# Patient Record
Sex: Male | Born: 1955 | Race: White | Hispanic: No | Smoking: Never smoker
Health system: Southern US, Community
[De-identification: ages and names within clinical notes are randomized; demographics above are authoritative.]

## PROBLEM LIST (undated history)

## (undated) DIAGNOSIS — M778 Other enthesopathies, not elsewhere classified: Secondary | ICD-10-CM

## (undated) DIAGNOSIS — T7840XA Allergy, unspecified, initial encounter: Secondary | ICD-10-CM

## (undated) DIAGNOSIS — M1712 Unilateral primary osteoarthritis, left knee: Secondary | ICD-10-CM

## (undated) DIAGNOSIS — I10 Essential (primary) hypertension: Secondary | ICD-10-CM

## (undated) HISTORY — DX: Other enthesopathies, not elsewhere classified: M77.8

## (undated) HISTORY — DX: Allergy, unspecified, initial encounter: T78.40XA

## (undated) HISTORY — PX: OTHER SURGICAL HISTORY: SHX169

## (undated) HISTORY — DX: Essential (primary) hypertension: I10

## (undated) HISTORY — PX: COLONOSCOPY: SHX174

## (undated) HISTORY — PX: POLYPECTOMY: SHX149

## (undated) HISTORY — DX: Unilateral primary osteoarthritis, left knee: M17.12

## (undated) HISTORY — PX: WISDOM TOOTH EXTRACTION: SHX21

---

## 2004-12-03 ENCOUNTER — Ambulatory Visit: Payer: Self-pay | Admitting: Internal Medicine

## 2004-12-10 ENCOUNTER — Ambulatory Visit: Payer: Self-pay | Admitting: Internal Medicine

## 2005-01-22 ENCOUNTER — Ambulatory Visit: Payer: Self-pay | Admitting: Internal Medicine

## 2005-03-12 ENCOUNTER — Ambulatory Visit: Payer: Self-pay | Admitting: Internal Medicine

## 2005-10-16 DIAGNOSIS — D229 Melanocytic nevi, unspecified: Secondary | ICD-10-CM

## 2005-10-16 HISTORY — DX: Melanocytic nevi, unspecified: D22.9

## 2007-02-17 ENCOUNTER — Ambulatory Visit: Payer: Self-pay | Admitting: Internal Medicine

## 2009-06-06 ENCOUNTER — Ambulatory Visit: Payer: Self-pay | Admitting: Internal Medicine

## 2009-06-06 LAB — CONVERTED CEMR LAB
BUN: 13 mg/dL (ref 6–23)
Basophils Absolute: 0 10*3/uL (ref 0.0–0.1)
Bilirubin Urine: NEGATIVE
Blood in Urine, dipstick: NEGATIVE
Cholesterol: 169 mg/dL (ref 0–200)
Creatinine, Ser: 1 mg/dL (ref 0.4–1.5)
GFR calc non Af Amer: 82.85 mL/min (ref >60)
Glucose, Bld: 90 mg/dL (ref 70–99)
Glucose, Urine, Semiquant: NEGATIVE
HCT: 45 % (ref 39.0–52.0)
Ketones, urine, test strip: NEGATIVE
Lymphs Abs: 1.9 10*3/uL (ref 0.7–4.0)
MCV: 88.3 fL (ref 78.0–100.0)
Monocytes Absolute: 0.4 10*3/uL (ref 0.1–1.0)
Monocytes Relative: 8.1 % (ref 3.0–12.0)
Neutrophils Relative %: 52 % (ref 43.0–77.0)
PSA: 1.35 ng/mL (ref 0.10–4.00)
Platelets: 180 10*3/uL (ref 150.0–400.0)
Potassium: 5 meq/L (ref 3.5–5.1)
Protein, U semiquant: NEGATIVE
RDW: 11.9 % (ref 11.5–14.6)
TSH: 3.43 microintl units/mL (ref 0.35–5.50)
Total Bilirubin: 1.1 mg/dL (ref 0.3–1.2)
Triglycerides: 31 mg/dL (ref 0.0–149.0)
Urobilinogen, UA: 0.2
VLDL: 6.2 mg/dL (ref 0.0–40.0)
pH: 7

## 2009-06-14 ENCOUNTER — Ambulatory Visit: Payer: Self-pay | Admitting: Internal Medicine

## 2010-06-27 ENCOUNTER — Ambulatory Visit: Payer: Self-pay | Admitting: Internal Medicine

## 2010-06-28 LAB — CONVERTED CEMR LAB
ALT: 14 units/L (ref 0–53)
Basophils Relative: 0.3 % (ref 0.0–3.0)
Bilirubin, Direct: 0.2 mg/dL (ref 0.0–0.3)
Chloride: 103 meq/L (ref 96–112)
Cholesterol: 164 mg/dL (ref 0–200)
Eosinophils Relative: 1.8 % (ref 0.0–5.0)
HCT: 45 % (ref 39.0–52.0)
Hemoglobin: 15.4 g/dL (ref 13.0–17.0)
LDL Cholesterol: 102 mg/dL — ABNORMAL HIGH (ref 0–99)
Lymphs Abs: 1.8 10*3/uL (ref 0.7–4.0)
MCV: 89.8 fL (ref 78.0–100.0)
Monocytes Absolute: 0.4 10*3/uL (ref 0.1–1.0)
Potassium: 4.3 meq/L (ref 3.5–5.1)
RBC: 5.01 M/uL (ref 4.22–5.81)
TSH: 3.28 microintl units/mL (ref 0.35–5.50)
Total Protein: 6.2 g/dL (ref 6.0–8.3)
WBC: 5.2 10*3/uL (ref 4.5–10.5)

## 2010-12-03 NOTE — Assessment & Plan Note (Signed)
Summary: cpx/pt will come in fasting/njr   Vital Signs:  Patient profile:   55 year old male Height:      67.5 inches Weight:      148 pounds BMI:     22.92 Pulse rate:   68 / minute Pulse rhythm:   regular Resp:     12 per minute BP sitting:   110 / 82  (left arm) Cuff size:   regular  Vitals Entered By: Gladis Riffle, RN (June 27, 2010 9:19 AM) CC: CPX, Is Patient Diabetic? No   CC:  CPX and .  History of Present Illness: cpx  Preventive Screening-Counseling & Management  Alcohol-Tobacco     Smoking Status: never  Current Problems (verified): 1)  Preventive Health Care  (ICD-V70.0)  Current Medications (verified): 1)  None  Allergies (verified): No Known Drug Allergies  Past History:  Past Medical History: Last updated: 06/14/2009 Unremarkable  Past Surgical History: Last updated: 06/14/2009 Denies surgical history  Family History: Last updated: 06/14/2009 mother HTN father living 50 yo  Social History: Last updated: 06/14/2009 Occupation: [employer] Married Never Smoked Alcohol use-yes--minimal  Risk Factors: Smoking Status: never (06/27/2010)  Review of Systems       All other systems reviewed and were negative   Contraindications/Deferment of Procedures/Staging:    Test/Procedure: Colonoscopy    Reason for deferment: patient declined   Physical Exam  General:  alert and well-developed.   Head:  normocephalic and atraumatic.   Eyes:  pupils equal and pupils round.   Ears:  R ear normal and L ear normal.   Neck:  No deformities, masses, or tenderness noted. Chest Wall:  No deformities, masses, tenderness or gynecomastia noted. Lungs:  normal respiratory effort and no intercostal retractions.   Heart:  normal rate and regular rhythm.   Abdomen:  soft and non-tender.  thin Prostate:  refused Msk:  No deformity or scoliosis noted of thoracic or lumbar spine.   Neurologic:  cranial nerves II-XII intact and  gait normal.   Skin:  turgor normal and color normal.   Psych:  normally interactive and good eye contact.     Impression & Recommendations:  Problem # 1:  PREVENTIVE HEALTH CARE (ICD-V70.0) refuses colonoscopy---he accepts all risks including death Orders: Venipuncture (91478) TLB-BMP (Basic Metabolic Panel-BMET) (80048-METABOL) TLB-CBC Platelet - w/Differential (85025-CBCD) TLB-Hepatic/Liver Function Pnl (80076-HEPATIC) TLB-TSH (Thyroid Stimulating Hormone) (84443-TSH) TLB-Lipid Panel (80061-LIPID) TLB-PSA (Prostate Specific Antigen) (84153-PSA) UA Dipstick w/o Micro (automated)  (81003)  Appended Document: Orders Update    Clinical Lists Changes  Orders: Added new Service order of Specimen Handling (29562) - Signed      Appended Document: Orders Update    Clinical Lists Changes  Observations: Added new observation of COMMENTS: Wynona Canes, CMA  June 27, 2010 10:40 AM  (06/27/2010 10:39) Added new observation of PH URINE: 7.0  (06/27/2010 10:39) Added new observation of SPEC GR URIN: 1.020  (06/27/2010 10:39) Added new observation of APPEARANCE U: Clear  (06/27/2010 10:39) Added new observation of UA COLOR: yellow  (06/27/2010 10:39) Added new observation of WBC DIPSTK U: negative  (06/27/2010 10:39) Added new observation of NITRITE URN: negative  (06/27/2010 10:39) Added new observation of UROBILINOGEN: 0.2  (06/27/2010 10:39) Added new observation of PROTEIN, URN: negative  (06/27/2010 10:39) Added new observation of BLOOD UR DIP: negative  (06/27/2010 10:39) Added new observation of KETONES URN: negative  (06/27/2010 10:39) Added new observation of BILIRUBIN UR: negative  (06/27/2010 10:39) Added new observation  of GLUCOSE, URN: negative  (06/27/2010 10:39)      Laboratory Results   Urine Tests  Date/Time Recieved: June 27, 2010 10:39 AM  Date/Time Reported: June 27, 2010 10:39 AM   Routine Urinalysis   Color: yellow Appearance:  Clear Glucose: negative   (Normal Range: Negative) Bilirubin: negative   (Normal Range: Negative) Ketone: negative   (Normal Range: Negative) Spec. Gravity: 1.020   (Normal Range: 1.003-1.035) Blood: negative   (Normal Range: Negative) pH: 7.0   (Normal Range: 5.0-8.0) Protein: negative   (Normal Range: Negative) Urobilinogen: 0.2   (Normal Range: 0-1) Nitrite: negative   (Normal Range: Negative) Leukocyte Esterace: negative   (Normal Range: Negative)    Comments: Wynona Canes, CMA  June 27, 2010 10:40 AM

## 2011-11-27 ENCOUNTER — Other Ambulatory Visit (INDEPENDENT_AMBULATORY_CARE_PROVIDER_SITE_OTHER): Payer: BC Managed Care – PPO

## 2011-11-27 DIAGNOSIS — Z0000 Encounter for general adult medical examination without abnormal findings: Secondary | ICD-10-CM

## 2011-11-27 LAB — CBC WITH DIFFERENTIAL/PLATELET
Basophils Relative: 0.3 % (ref 0.0–3.0)
Eosinophils Relative: 1.9 % (ref 0.0–5.0)
HCT: 46.5 % (ref 39.0–52.0)
Hemoglobin: 15.5 g/dL (ref 13.0–17.0)
Lymphs Abs: 1.9 10*3/uL (ref 0.7–4.0)
Monocytes Relative: 7.9 % (ref 3.0–12.0)
Neutro Abs: 2.4 10*3/uL (ref 1.4–7.7)
RBC: 5.1 Mil/uL (ref 4.22–5.81)
RDW: 13 % (ref 11.5–14.6)
WBC: 4.9 10*3/uL (ref 4.5–10.5)

## 2011-11-27 LAB — POCT URINALYSIS DIPSTICK
Bilirubin, UA: NEGATIVE
Ketones, UA: NEGATIVE
Leukocytes, UA: NEGATIVE
Nitrite, UA: NEGATIVE

## 2011-11-27 LAB — BASIC METABOLIC PANEL
GFR: 81.16 mL/min (ref >60.00)
Glucose, Bld: 82 mg/dL (ref 70–99)
Potassium: 4.2 mEq/L (ref 3.5–5.1)
Sodium: 140 mEq/L (ref 135–145)

## 2011-11-27 LAB — HEPATIC FUNCTION PANEL
ALT: 18 U/L (ref 0–53)
AST: 23 U/L (ref 0–37)
Albumin: 4 g/dL (ref 3.5–5.2)
Total Protein: 6.3 g/dL (ref 6.0–8.3)

## 2011-11-27 LAB — TSH: TSH: 4.35 u[IU]/mL (ref 0.35–5.50)

## 2011-11-27 LAB — PSA: PSA: 1.46 ng/mL (ref 0.10–4.00)

## 2011-11-27 LAB — LIPID PANEL: Total CHOL/HDL Ratio: 3

## 2011-12-03 ENCOUNTER — Ambulatory Visit (INDEPENDENT_AMBULATORY_CARE_PROVIDER_SITE_OTHER): Payer: BC Managed Care – PPO | Admitting: Internal Medicine

## 2011-12-03 ENCOUNTER — Encounter: Payer: Self-pay | Admitting: Internal Medicine

## 2011-12-03 VITALS — BP 110/82 | HR 68 | Temp 97.9°F | Ht 68.0 in | Wt 154.0 lb

## 2011-12-03 DIAGNOSIS — Z0000 Encounter for general adult medical examination without abnormal findings: Secondary | ICD-10-CM

## 2011-12-03 NOTE — Progress Notes (Signed)
  Subjective:    Patient ID: Louis Vaughn, male    DOB: 08/23/1956, 56 y.o.   MRN: [id number]  HPI  cpx  No past medical history on file. No past surgical history on file.  reports that he has never smoked. He does not have any smokeless tobacco history on file. He reports that he drinks alcohol. He reports that he does not use illicit drugs. family history includes Hypertension in his mother. No Known Allergies   Review of Systems  patient denies chest pain, shortness of breath, orthopnea. Denies lower extremity edema, abdominal pain, change in appetite, change in bowel movements. Patient denies rashes, musculoskeletal complaints. No other specific complaints in a complete review of systems.      Objective:   Physical Exam  well-developed well-nourished male in no acute distress. HEENT exam atraumatic, normocephalic, neck supple without jugular venous distention. Chest clear to auscultation cardiac exam S1-S2 are regular. Abdominal exam overweight with bowel sounds, soft and nontender. Extremities no edema. Neurologic exam is alert with a normal gait.        Assessment & Plan:  Well visit-health maint utd

## 2012-03-10 ENCOUNTER — Ambulatory Visit: Payer: BC Managed Care – PPO | Admitting: Family

## 2012-11-02 ENCOUNTER — Ambulatory Visit (INDEPENDENT_AMBULATORY_CARE_PROVIDER_SITE_OTHER)
Admission: RE | Admit: 2012-11-02 | Discharge: 2012-11-02 | Disposition: A | Discharge status: Home / Self Care | Payer: BC Managed Care – PPO | Source: Ambulatory Visit | Attending: Family Medicine | Admitting: Family Medicine

## 2012-11-02 ENCOUNTER — Telehealth: Payer: Self-pay | Admitting: Family Medicine

## 2012-11-02 ENCOUNTER — Ambulatory Visit (INDEPENDENT_AMBULATORY_CARE_PROVIDER_SITE_OTHER): Payer: BC Managed Care – PPO | Admitting: Family Medicine

## 2012-11-02 ENCOUNTER — Encounter: Payer: Self-pay | Admitting: Family Medicine

## 2012-11-02 ENCOUNTER — Telehealth: Payer: Self-pay | Admitting: Internal Medicine

## 2012-11-02 VITALS — BP 152/90 | HR 67 | Temp 97.8°F | Wt 153.0 lb

## 2012-11-02 DIAGNOSIS — M79642 Pain in left hand: Secondary | ICD-10-CM

## 2012-11-02 DIAGNOSIS — M79609 Pain in unspecified limb: Secondary | ICD-10-CM

## 2012-11-02 DIAGNOSIS — S62308A Unspecified fracture of other metacarpal bone, initial encounter for closed fracture: Secondary | ICD-10-CM

## 2012-11-02 NOTE — Progress Notes (Addendum)
Chief Complaint  Patient presents with  . Fall    hurt left hand; swollen and redness; using ice and heat; sharpe pain     HPI:  Acute visit for L hand pain: -fell on grass 2 days ago -fell on flex wrist - hurt a little and but can move it fine -but has had some swelling and pain in a specific point in fifth metacarpal - worried about a fx of   ROS: See pertinent positives and negatives per HPI.  No past medical history on file.  Family History  Problem Relation Age of Onset  . Hypertension Mother     History   Social History  . Marital Status: Single    Spouse Name: N/A    Number of Children: N/A  . Years of Education: N/A   Social History Main Topics  . Smoking status: Never Smoker   . Smokeless tobacco: None  . Alcohol Use: Yes  . Drug Use: No  . Sexually Active:    Other Topics Concern  . None   Social History Narrative  . None    Current outpatient prescriptions:Ciclopirox 1 % shampoo, Apply topically 2 (two) times daily. , Disp: , Rfl:   EXAM:  Filed Vitals:   11/02/12 0943  BP: 152/90  Pulse: 67  Temp: 97.8 F (36.6 C)    There is no height on file to calculate BMI.  GENERAL: vitals reviewed and listed above, alert, oriented, appears well hydrated and in no acute distress  MS: moves all extremities without noticeable abnormality -normal motion and strength throughout in bilat hands and wrists -normal sensation in hand and fingers, normal radial pulses and distal cap refill -TTP in area of L 4-5 distal metacarpals, minimal swelling and bruising of dorsal ulnar hand -no snuff box, radial, ulnar or carpal TTP  PSYCH: pleasant and cooperative, no obvious depression or anxiety  ASSESSMENT AND PLAN:  Discussed the following assessment and plan:  1. Hand pain, left  DG Hand Complete Left, DG Wrist Complete Left   -will get plain films to exclude fx, if neg suspect contusion -Patient advised to return or notify a doctor immediately if symptoms  worsen or persist or new concerns arise and in 2 weeks if not resolved.  Patient Instructions  -use ibuprofen if needed according to instructions  -ice twice daily for 15 minutes  -we will contact you about xray results     Spurgeon Gancarz, Dahlia Client R.

## 2012-11-02 NOTE — Telephone Encounter (Signed)
Please let him know - he did fracture his hand. Trying to get him in with ortho today as needs splint and for further eval.

## 2012-11-02 NOTE — Patient Instructions (Addendum)
-  use ibuprofen if needed according to instructions  -ice twice daily for 15 minutes  -we will contact you about xray results

## 2012-11-02 NOTE — Telephone Encounter (Signed)
Called and left a message for pt to return call.  

## 2012-11-02 NOTE — Telephone Encounter (Signed)
Pt is requesting hand xray results

## 2012-11-02 NOTE — Telephone Encounter (Signed)
Called pt at [phone] and gave x-ray results.  Per Dr. Selena Batten pt has a finger fracture that needs orthopedic treatment.  Pt is aware that this is a complicated fracture due to location and pt could possibly need a splint if not more.  Pt states he understand and is on his way to the orthopedic specialist.

## 2012-11-02 NOTE — Telephone Encounter (Signed)
See previous note. Duplicate message.   

## 2012-12-18 ENCOUNTER — Other Ambulatory Visit: Payer: Self-pay

## 2013-03-26 IMAGING — CR DG HAND COMPLETE 3+V*L*
3 series · 3 of 3 positions shown · non-contrast
Comparison: None.

CLINICAL DATA: Pain, swelling over fourth and fifth metacarpals.
Fall.

LEFT HAND - COMPLETE 3+ VIEW

[view not recorded (1 of 3)]
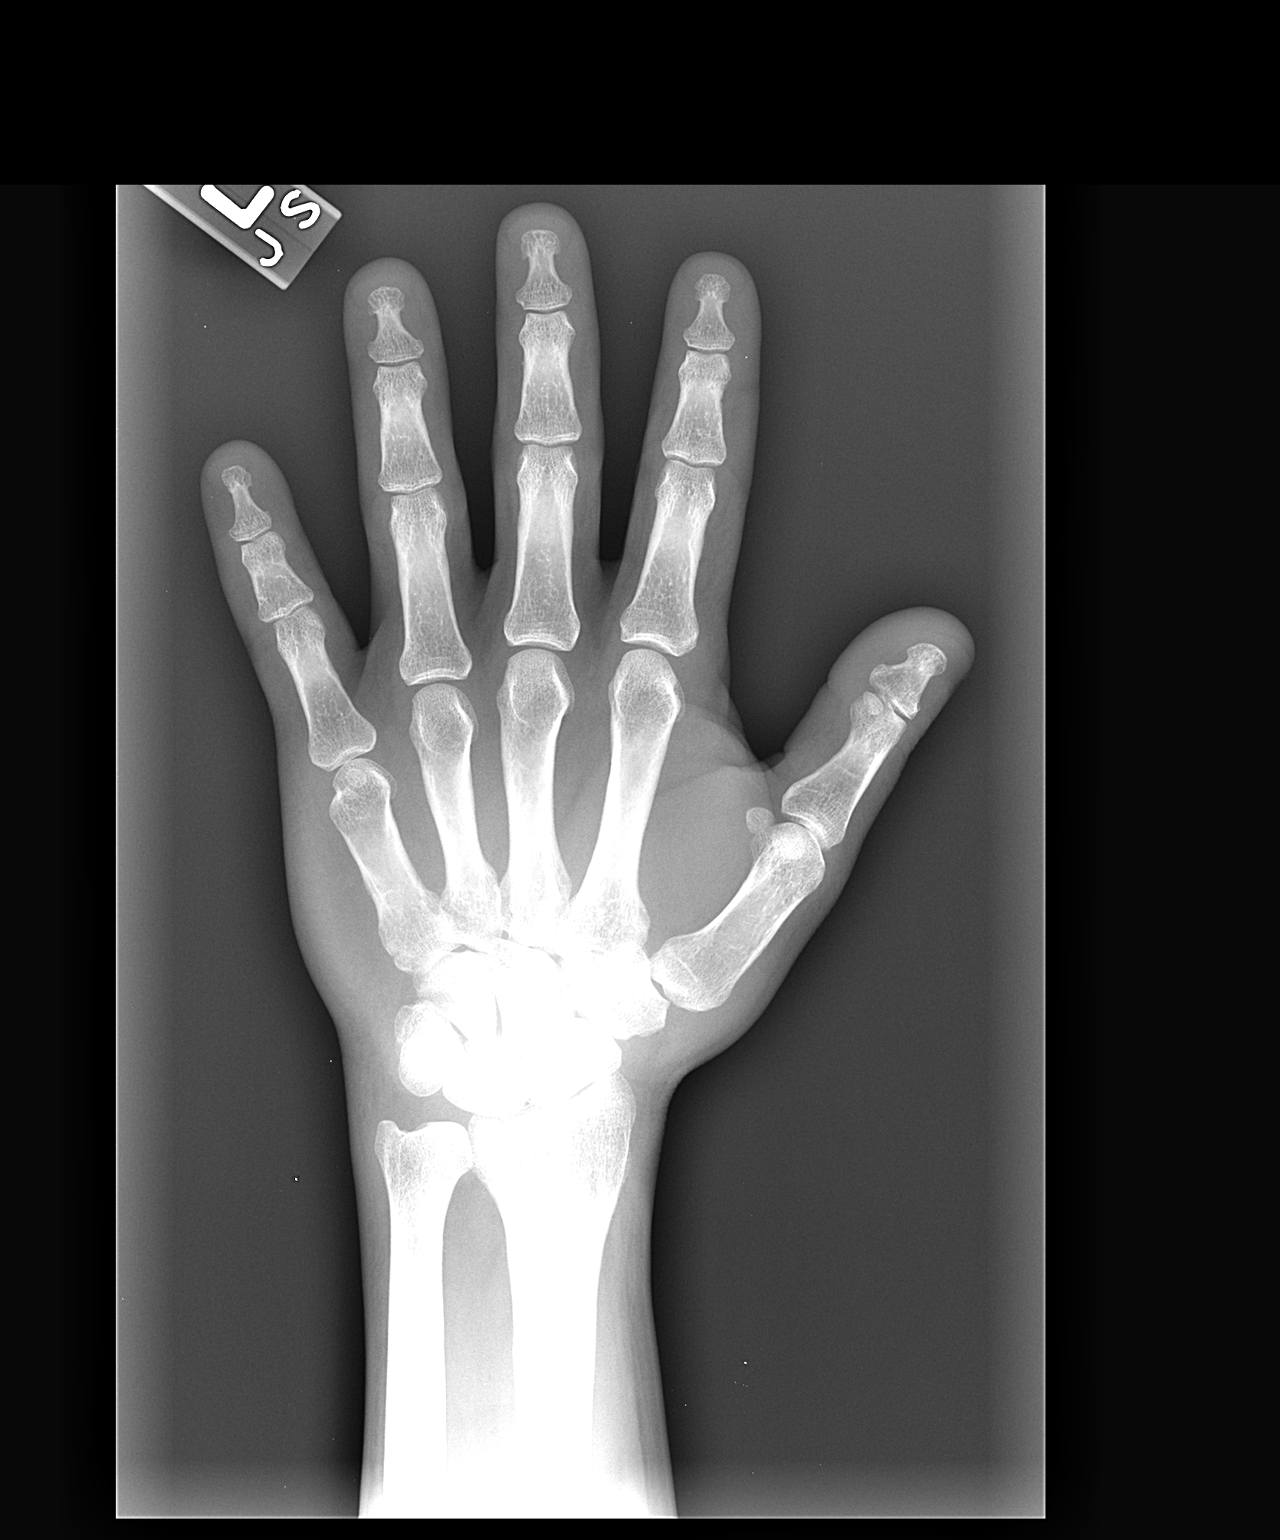

[view not recorded (2 of 3)]
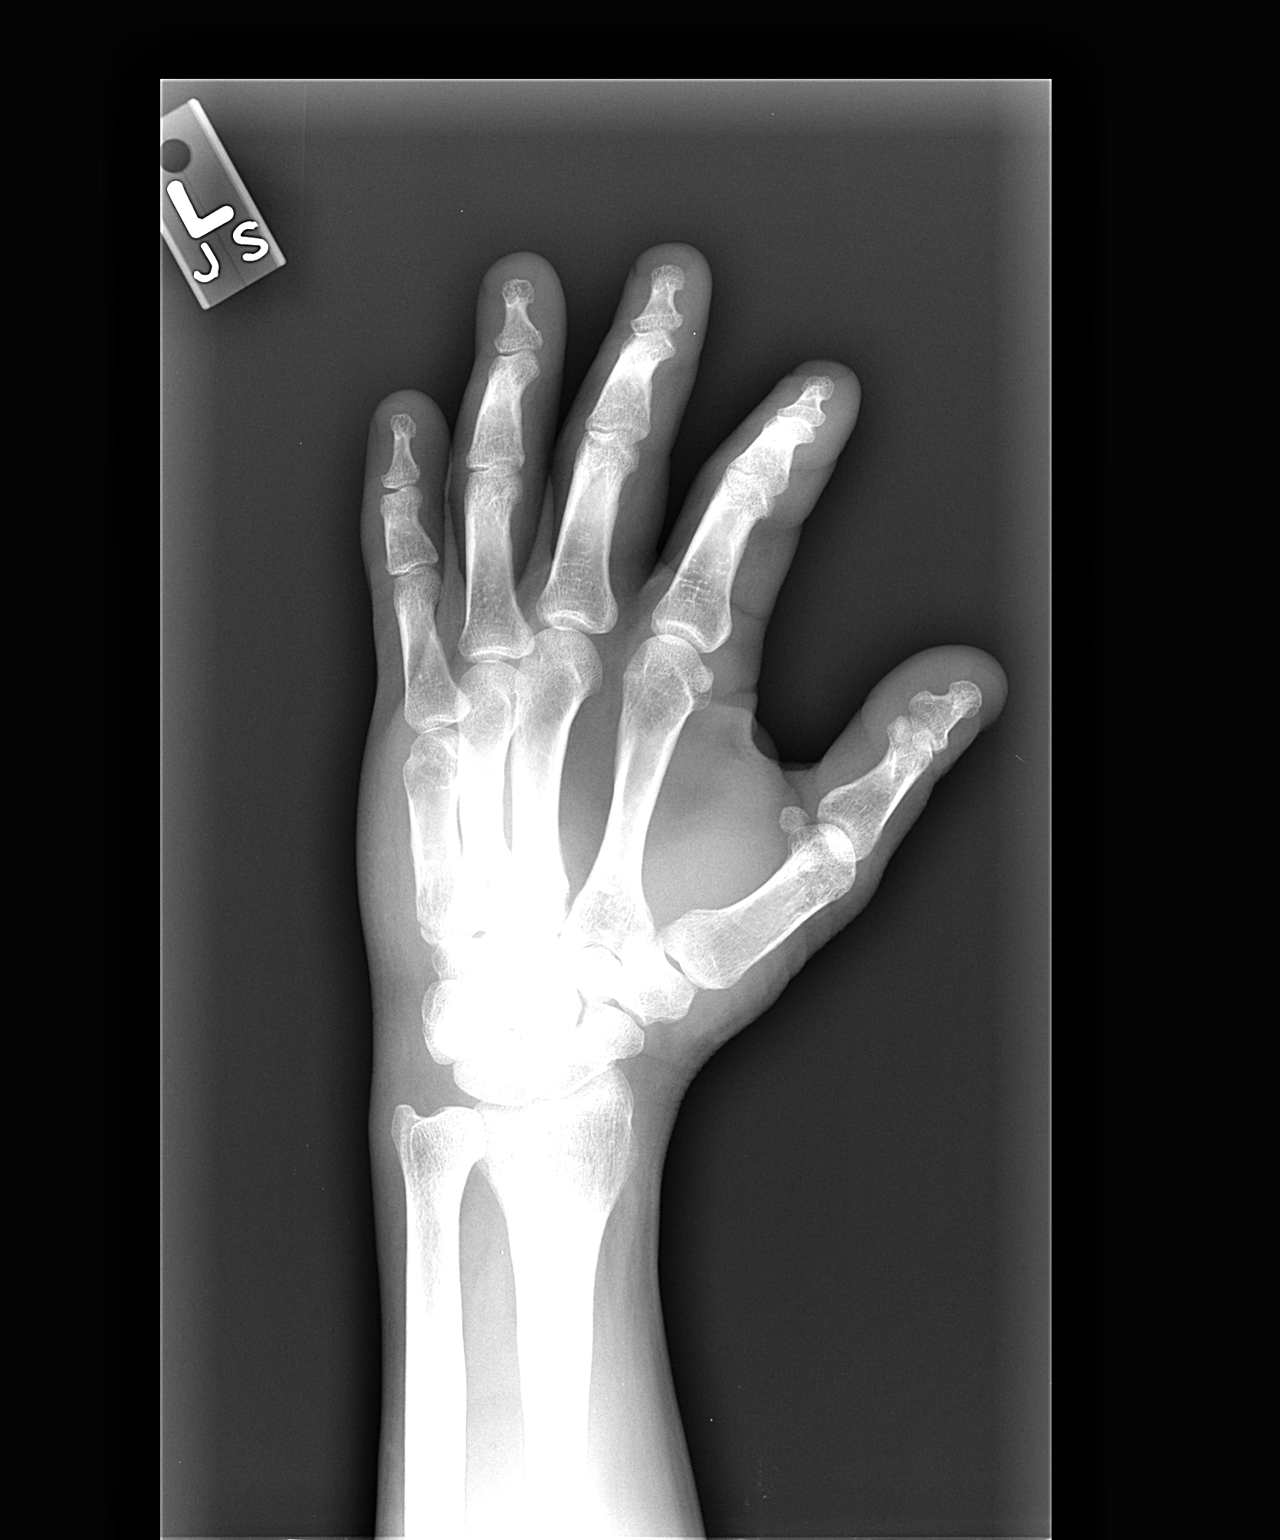

[view not recorded (3 of 3)]
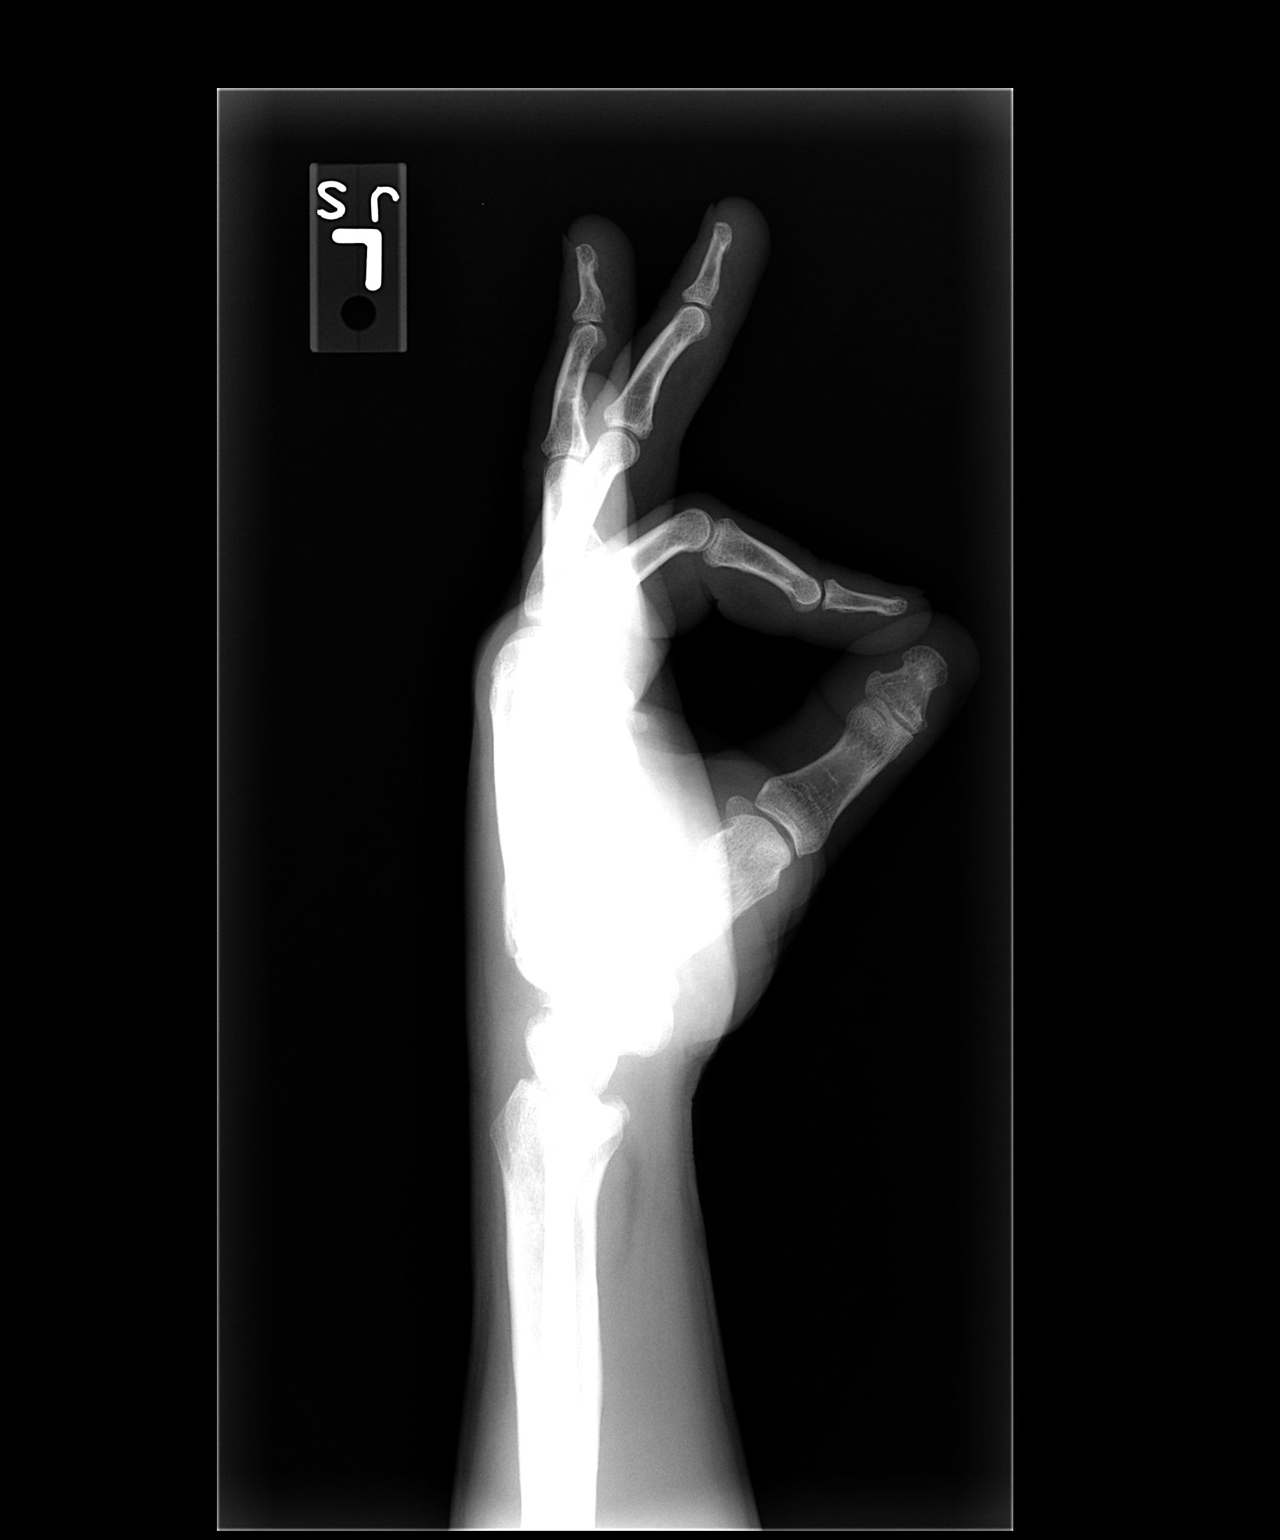

[3 of 3 positions shown; findings below may reference images not displayed]

FINDINGS: There is a nondisplaced fracture noted at the base of the
left fifth metacarpal seen best on the oblique view.  Overlying
soft tissue swelling.  Congenitally short fifth metacarpal.  The
joint spaces are maintained.  Normal bone mineralization.
IMPRESSION: Fracture through the base of the left fifth metacarpal.

## 2013-09-08 ENCOUNTER — Other Ambulatory Visit: Payer: Self-pay

## 2014-01-18 ENCOUNTER — Ambulatory Visit (INDEPENDENT_AMBULATORY_CARE_PROVIDER_SITE_OTHER): Payer: BC Managed Care – PPO | Admitting: Internal Medicine

## 2014-01-18 ENCOUNTER — Encounter: Payer: Self-pay | Admitting: Internal Medicine

## 2014-01-18 VITALS — BP 134/92 | HR 60 | Temp 97.4°F | Ht 68.0 in | Wt 148.0 lb

## 2014-01-18 DIAGNOSIS — Z0000 Encounter for general adult medical examination without abnormal findings: Secondary | ICD-10-CM

## 2014-01-18 LAB — CBC WITH DIFFERENTIAL/PLATELET
BASOS ABS: 0 10*3/uL (ref 0.0–0.1)
Basophils Relative: 0.3 % (ref 0.0–3.0)
EOS ABS: 0.3 10*3/uL (ref 0.0–0.7)
Eosinophils Relative: 5.6 % — ABNORMAL HIGH (ref 0.0–5.0)
HCT: 45.2 % (ref 39.0–52.0)
Hemoglobin: 15.2 g/dL (ref 13.0–17.0)
LYMPHS PCT: 27.2 % (ref 12.0–46.0)
Lymphs Abs: 1.7 10*3/uL (ref 0.7–4.0)
MCHC: 33.5 g/dL (ref 30.0–36.0)
MCV: 90.2 fl (ref 78.0–100.0)
MONO ABS: 0.5 10*3/uL (ref 0.1–1.0)
Monocytes Relative: 8.5 % (ref 3.0–12.0)
NEUTROS PCT: 58.4 % (ref 43.0–77.0)
Neutro Abs: 3.6 10*3/uL (ref 1.4–7.7)
Platelets: 225 10*3/uL (ref 150.0–400.0)
RBC: 5.02 Mil/uL (ref 4.22–5.81)
RDW: 12.9 % (ref 11.5–14.6)
WBC: 6.1 10*3/uL (ref 4.5–10.5)

## 2014-01-18 LAB — POCT URINALYSIS DIPSTICK
BILIRUBIN UA: NEGATIVE
Blood, UA: NEGATIVE
GLUCOSE UA: NEGATIVE
LEUKOCYTES UA: NEGATIVE
NITRITE UA: NEGATIVE
PH UA: 5.5
Protein, UA: NEGATIVE
Spec Grav, UA: 1.02
UROBILINOGEN UA: 0.2

## 2014-01-18 LAB — BASIC METABOLIC PANEL
BUN: 10 mg/dL (ref 6–23)
CALCIUM: 9.2 mg/dL (ref 8.4–10.5)
CHLORIDE: 105 meq/L (ref 96–112)
CO2: 28 meq/L (ref 19–32)
CREATININE: 0.9 mg/dL (ref 0.4–1.5)
GFR: 94.43 mL/min (ref >60.00)
GLUCOSE: 93 mg/dL (ref 70–99)
Potassium: 4.1 mEq/L (ref 3.5–5.1)
Sodium: 138 mEq/L (ref 135–145)

## 2014-01-18 LAB — HEPATIC FUNCTION PANEL
ALBUMIN: 4 g/dL (ref 3.5–5.2)
ALK PHOS: 71 U/L (ref 39–117)
ALT: 20 U/L (ref 0–53)
AST: 33 U/L (ref 0–37)
BILIRUBIN DIRECT: 0.1 mg/dL (ref 0.0–0.3)
TOTAL PROTEIN: 6.3 g/dL (ref 6.0–8.3)
Total Bilirubin: 0.9 mg/dL (ref 0.3–1.2)

## 2014-01-18 LAB — PSA: PSA: 1.77 ng/mL (ref 0.10–4.00)

## 2014-01-18 LAB — LIPID PANEL
CHOL/HDL RATIO: 3
Cholesterol: 141 mg/dL (ref 0–200)
HDL: 53.8 mg/dL (ref >39.00)
LDL Cholesterol: 81 mg/dL (ref 0–99)
Triglycerides: 31 mg/dL (ref 0.0–149.0)
VLDL: 6.2 mg/dL (ref 0.0–40.0)

## 2014-01-18 LAB — TSH: TSH: 4.41 u[IU]/mL (ref 0.35–5.50)

## 2014-01-18 NOTE — Progress Notes (Signed)
Pre visit review using our clinic review tool, if applicable. No additional management support is needed unless otherwise documented below in the visit note. 

## 2014-01-18 NOTE — Progress Notes (Signed)
cpx  No past medical history on file.  History   Social History  . Marital Status: Single    Spouse Name: N/A    Number of Children: N/A  . Years of Education: N/A   Occupational History  . Not on file.   Social History Main Topics  . Smoking status: Never Smoker   . Smokeless tobacco: Not on file  . Alcohol Use: Yes  . Drug Use: No  . Sexual Activity:    Other Topics Concern  . Not on file   Social History Narrative  . No narrative on file    No past surgical history on file.  Family History  Problem Relation Age of Onset  . Hypertension Mother     No Known Allergies  Current Outpatient Prescriptions on File Prior to Visit  Medication Sig Dispense Refill  . Ciclopirox 1 % shampoo Apply topically 2 (two) times daily.        No current facility-administered medications on file prior to visit.     patient denies chest pain, shortness of breath, orthopnea. Denies lower extremity edema, abdominal pain, change in appetite, change in bowel movements. Patient denies rashes, musculoskeletal complaints. No other specific complaints in a complete review of systems.   Reviewed vitals  well-developed well-nourished male in no acute distress. HEENT exam atraumatic, normocephalic, neck supple without jugular venous distention. Chest clear to auscultation cardiac exam S1-S2 are regular. Abdominal exam overweight with bowel sounds, soft and nontender. Extremities no edema. Neurologic exam is alert with a normal gait.   Well visit- health maint UTD

## 2014-01-19 ENCOUNTER — Other Ambulatory Visit: Payer: BC Managed Care – PPO

## 2014-01-23 ENCOUNTER — Encounter: Payer: BC Managed Care – PPO | Admitting: Internal Medicine

## 2014-02-20 ENCOUNTER — Encounter: Payer: Self-pay | Admitting: Internal Medicine

## 2014-08-15 ENCOUNTER — Encounter: Payer: Self-pay | Admitting: Internal Medicine

## 2014-08-15 ENCOUNTER — Ambulatory Visit (INDEPENDENT_AMBULATORY_CARE_PROVIDER_SITE_OTHER): Payer: BC Managed Care – PPO | Admitting: Internal Medicine

## 2014-08-15 VITALS — BP 160/92 | HR 78 | Temp 98.2°F | Resp 18 | Ht 68.0 in | Wt 151.0 lb

## 2014-08-15 DIAGNOSIS — I10 Essential (primary) hypertension: Secondary | ICD-10-CM

## 2014-08-15 MED ORDER — LISINOPRIL 20 MG PO TABS: 20.0000 mg | ORAL_TABLET | Freq: Every day | ORAL | Status: DC

## 2014-08-15 NOTE — Patient Instructions (Signed)
Limit your sodium (Salt) intake  Please check your blood pressure on a regular basis.  If it is consistently greater than 150/90, please make an office appointment. Hypertension Hypertension, commonly called high blood pressure, is when the force of blood pumping through your arteries is too strong. Your arteries are the blood vessels that carry blood from your heart throughout your body. A blood pressure reading consists of a higher number over a lower number, such as 110/72. The higher number (systolic) is the pressure inside your arteries when your heart pumps. The lower number (diastolic) is the pressure inside your arteries when your heart relaxes. Ideally you want your blood pressure below 120/80. Hypertension forces your heart to work harder to pump blood. Your arteries may become narrow or stiff. Having hypertension puts you at risk for heart disease, stroke, and other problems.  RISK FACTORS Some risk factors for high blood pressure are controllable. Others are not.  Risk factors you cannot control include:   Race. You may be at higher risk if you are African American.  Age. Risk increases with age.  Gender. Men are at higher risk than women before age 19 years. After age 42, women are at higher risk than men. Risk factors you can control include:  Not getting enough exercise or physical activity.  Being overweight.  Getting too much fat, sugar, calories, or salt in your diet.  Drinking too much alcohol. SIGNS AND SYMPTOMS Hypertension does not usually cause signs or symptoms. Extremely high blood pressure (hypertensive crisis) may cause headache, anxiety, shortness of breath, and nosebleed. DIAGNOSIS  To check if you have hypertension, your health care provider will measure your blood pressure while you are seated, with your arm held at the level of your heart. It should be measured at least twice using the same arm. Certain conditions can cause a difference in blood pressure  between your right and left arms. A blood pressure reading that is higher than normal on one occasion does not mean that you need treatment. If one blood pressure reading is high, ask your health care provider about having it checked again. TREATMENT  Treating high blood pressure includes making lifestyle changes and possibly taking medicine. Living a healthy lifestyle can help lower high blood pressure. You may need to change some of your habits. Lifestyle changes may include:  Following the DASH diet. This diet is high in fruits, vegetables, and whole grains. It is low in salt, red meat, and added sugars.  Getting at least 2 hours of brisk physical activity every week.  Losing weight if necessary.  Not smoking.  Limiting alcoholic beverages.  Learning ways to reduce stress. If lifestyle changes are not enough to get your blood pressure under control, your health care provider may prescribe medicine. You may need to take more than one. Work closely with your health care provider to understand the risks and benefits. HOME CARE INSTRUCTIONS  Have your blood pressure rechecked as directed by your health care provider.   Take medicines only as directed by your health care provider. Follow the directions carefully. Blood pressure medicines must be taken as prescribed. The medicine does not work as well when you skip doses. Skipping doses also puts you at risk for problems.   Do not smoke.   Monitor your blood pressure at home as directed by your health care provider. SEEK MEDICAL CARE IF:   You think you are having a reaction to medicines taken.  You have recurrent headaches or feel  dizzy.  You have swelling in your ankles.  You have trouble with your vision. SEEK IMMEDIATE MEDICAL CARE IF:  You develop a severe headache or confusion.  You have unusual weakness, numbness, or feel faint.  You have severe chest or abdominal pain.  You vomit repeatedly.  You have trouble  breathing. MAKE SURE YOU:   Understand these instructions.  Will watch your condition.  Will get help right away if you are not doing well or get worse. Document Released: 10/20/2005 Document Revised: 03/06/2014 Document Reviewed: 08/12/2013 [facility] Patient Information 2015 [place], [place]. This information is not intended to replace advice given to you by your health care provider. Make sure you discuss any questions you have with your health care provider. DASH Eating Plan DASH stands for "Dietary Approaches to Stop Hypertension." The DASH eating plan is a healthy eating plan that has been shown to reduce high blood pressure (hypertension). Additional health benefits may include reducing the risk of type 2 diabetes mellitus, heart disease, and stroke. The DASH eating plan may also help with weight loss. WHAT DO I NEED TO KNOW ABOUT THE DASH EATING PLAN? For the DASH eating plan, you will follow these general guidelines:  Choose foods with a percent daily value for sodium of less than 5% (as listed on the food label).  Use salt-free seasonings or herbs instead of table salt or sea salt.  Check with your health care provider or pharmacist before using salt substitutes.  Eat lower-sodium products, often labeled as "lower sodium" or "no salt added."  Eat fresh foods.  Eat more vegetables, fruits, and low-fat dairy products.  Choose whole grains. Look for the word "whole" as the first word in the ingredient list.  Choose fish and skinless chicken or [place] more often than red meat. Limit fish, poultry, and meat to 6 oz (170 g) each day.  Limit sweets, desserts, sugars, and sugary drinks.  Choose heart-healthy fats.  Limit cheese to 1 oz (28 g) per day.  Eat more home-cooked food and less restaurant, buffet, and fast food.  Limit fried foods.  Cook foods using methods other than frying.  Limit canned vegetables. If you do use them, rinse them well to decrease the  sodium.  When eating at a restaurant, ask that your food be prepared with less salt, or no salt if possible. WHAT FOODS CAN I EAT? Seek help from a dietitian for individual calorie needs. Grains Whole grain or whole wheat bread. Brown rice. Whole grain or whole wheat pasta. Quinoa, bulgur, and whole grain cereals. Low-sodium cereals. Corn or whole wheat flour tortillas. Whole grain cornbread. Whole grain crackers. Low-sodium crackers. Vegetables Fresh or frozen vegetables (raw, steamed, roasted, or grilled). Low-sodium or reduced-sodium tomato and vegetable juices. Low-sodium or reduced-sodium tomato sauce and paste. Low-sodium or reduced-sodium canned vegetables.  Fruits All fresh, canned (in natural juice), or frozen fruits. Meat and Other Protein Products Ground beef (85% or leaner), grass-fed beef, or beef trimmed of fat. Skinless chicken or [place]. Ground chicken or [place]. Pork trimmed of fat. All fish and seafood. Eggs. Dried beans, peas, or lentils. Unsalted nuts and seeds. Unsalted canned beans. Dairy Low-fat dairy products, such as skim or 1% milk, 2% or reduced-fat cheeses, low-fat ricotta or cottage cheese, or plain low-fat yogurt. Low-sodium or reduced-sodium cheeses. Fats and Oils Tub margarines without trans fats. Light or reduced-fat mayonnaise and salad dressings (reduced sodium). Avocado. Safflower, olive, or canola oils. Natural peanut or almond butter. Other Unsalted popcorn and pretzels.  The items listed above may not be a complete list of recommended foods or beverages. Contact your dietitian for more options. WHAT FOODS ARE NOT RECOMMENDED? Grains White bread. White pasta. White rice. Refined cornbread. Bagels and croissants. Crackers that contain trans fat. Vegetables Creamed or fried vegetables. Vegetables in a cheese sauce. Regular canned vegetables. Regular canned tomato sauce and paste. Regular tomato and vegetable juices. Fruits Dried fruits. Canned fruit in  light or heavy syrup. Fruit juice. Meat and Other Protein Products Fatty cuts of meat. Ribs, chicken wings, bacon, sausage, bologna, salami, chitterlings, fatback, hot dogs, bratwurst, and packaged luncheon meats. Salted nuts and seeds. Canned beans with salt. Dairy Whole or 2% milk, cream, half-and-half, and cream cheese. Whole-fat or sweetened yogurt. Full-fat cheeses or blue cheese. Nondairy creamers and whipped toppings. Processed cheese, cheese spreads, or cheese curds. Condiments Onion and garlic salt, seasoned salt, table salt, and sea salt. Canned and packaged gravies. Worcestershire sauce. Tartar sauce. Barbecue sauce. Teriyaki sauce. Soy sauce, including reduced sodium. Steak sauce. Fish sauce. Oyster sauce. Cocktail sauce. Horseradish. Ketchup and mustard. Meat flavorings and tenderizers. Bouillon cubes. Hot sauce. Tabasco sauce. Marinades. Taco seasonings. Relishes. Fats and Oils Butter, stick margarine, lard, shortening, ghee, and bacon fat. Coconut, palm kernel, or palm oils. Regular salad dressings. Other Pickles and olives. Salted popcorn and pretzels. The items listed above may not be a complete list of foods and beverages to avoid. Contact your dietitian for more information. WHERE CAN I FIND MORE INFORMATION? National Heart, Lung, and Blood Institute: travelstabloid.com Document Released: 10/09/2011 Document Revised: 03/06/2014 Document Reviewed: 08/24/2013 [facility] Patient Information 2015 [place], [place]. This information is not intended to replace advice given to you by your health care provider. Make sure you discuss any questions you have with your health care provider.

## 2014-08-15 NOTE — Progress Notes (Signed)
   Subjective:    Patient ID: Louis Vaughn, male    DOB: 1956-10-17, 58 y.o.   MRN: [id number]  HPI \ BP Readings from Last 3 Encounters:  08/15/14 160/92  01/18/14 134/92  11/02/12 152/90     Review of Systems     Objective:   Physical Exam        Assessment & Plan:

## 2014-08-15 NOTE — Progress Notes (Signed)
Pre visit review using our clinic review tool, if applicable. No additional management support is needed unless otherwise documented below in the visit note. 

## 2014-08-15 NOTE — Progress Notes (Signed)
Subjective:    Patient ID: Louis Vaughn, male    DOB: January 29, 1956, 58 y.o.   MRN: [id number]  HPI BP Readings from Last 3 Encounters:  08/15/14 160/92  01/18/14 134/92  11/02/12 74/79   58 year old patient without prior history of hypertension.  The past couple weeks.  He has had frequent blood pressure elevations occasionally as high as 172 over 126.  At times.  He has had the some associated headaches.  His mother has a history of hypertension.  Today he feels well.  Blood pressure arrival 160 over 92  History reviewed. No pertinent past medical history.  History   Social History  . Marital Status: Single    Spouse Name: N/A    Number of Children: N/A  . Years of Education: N/A   Occupational History  . Not on file.   Social History Main Topics  . Smoking status: Never Smoker   . Smokeless tobacco: Not on file  . Alcohol Use: Yes  . Drug Use: No  . Sexual Activity:    Other Topics Concern  . Not on file   Social History Narrative  . No narrative on file    History reviewed. No pertinent past surgical history.  Family History  Problem Relation Age of Onset  . Hypertension Mother     No Known Allergies  Current Outpatient Prescriptions on File Prior to Visit  Medication Sig Dispense Refill  . Ciclopirox 1 % shampoo Apply topically 2 (two) times daily.       . Glucosamine 750 MG TABS Take 3 tablets by mouth daily.       No current facility-administered medications on file prior to visit.    BP 160/92  Pulse 78  Temp(Src) 98.2 F (36.8 C) (Oral)  Resp 18  Ht 5\' 8"  (1.727 m)  Wt 151 lb (68.493 kg)  BMI 22.96 kg/m2  SpO2 98%      Review of Systems  Constitutional: Negative for fever, chills, appetite change and fatigue.  HENT: Negative for congestion, dental problem, ear pain, hearing loss, sore throat, tinnitus, trouble swallowing and voice change.   Eyes: Negative for pain, discharge and visual disturbance.  Respiratory: Negative for  cough, chest tightness, wheezing and stridor.   Cardiovascular: Negative for chest pain, palpitations and leg swelling.  Gastrointestinal: Negative for nausea, vomiting, abdominal pain, diarrhea, constipation, blood in stool and abdominal distention.  Genitourinary: Negative for urgency, hematuria, flank pain, discharge, difficulty urinating and genital sores.  Musculoskeletal: Negative for arthralgias, back pain, gait problem, joint swelling, myalgias and neck stiffness.  Skin: Negative for rash.  Neurological: Positive for headaches. Negative for dizziness, syncope, speech difficulty, weakness and numbness.  Hematological: Negative for adenopathy. Does not bruise/bleed easily.  Psychiatric/Behavioral: Negative for behavioral problems and dysphoric mood. The patient is not nervous/anxious.        Objective:   Physical Exam  Constitutional: He is oriented to person, place, and time. He appears well-developed.  The pressure 155 over 90 in both arms  HENT:  Head: Normocephalic.  Right Ear: External ear normal.  Left Ear: External ear normal.  Eyes: Conjunctivae and EOM are normal.  Neck: Normal range of motion.  Cardiovascular: Normal rate, regular rhythm and normal heart sounds.   Pulmonary/Chest: Breath sounds normal.  Abdominal: Bowel sounds are normal.  Musculoskeletal: Normal range of motion. He exhibits no edema and no tenderness.  Neurological: He is alert and oriented to person, place, and time.  Psychiatric: He has a normal  mood and affect. His behavior is normal.          Assessment & Plan:   Hypertension.  We'll place on a DASH program.  Start lisinopril 20 mg daily.  Low salt diet.  Home blood pressure monitoring.  Recommended.  Recheck here in 4 weeks

## 2014-08-16 ENCOUNTER — Telehealth: Payer: Self-pay | Admitting: Internal Medicine

## 2014-08-16 NOTE — Telephone Encounter (Signed)
emmi emailed °

## 2014-09-11 ENCOUNTER — Encounter: Payer: Self-pay | Admitting: Family Medicine

## 2014-09-11 ENCOUNTER — Ambulatory Visit (INDEPENDENT_AMBULATORY_CARE_PROVIDER_SITE_OTHER): Payer: BC Managed Care – PPO | Admitting: Family Medicine

## 2014-09-11 VITALS — BP 92/72 | Temp 98.0°F | Wt 149.0 lb

## 2014-09-11 DIAGNOSIS — I10 Essential (primary) hypertension: Secondary | ICD-10-CM

## 2014-09-11 LAB — BASIC METABOLIC PANEL
BUN: 21 mg/dL (ref 6–23)
CALCIUM: 9.2 mg/dL (ref 8.4–10.5)
CO2: 29 mEq/L (ref 19–32)
Chloride: 100 mEq/L (ref 96–112)
Creatinine, Ser: 0.9 mg/dL (ref 0.4–1.5)
GFR: 94.22 mL/min (ref >60.00)
Glucose, Bld: 83 mg/dL (ref 70–99)
Potassium: 4.4 mEq/L (ref 3.5–5.1)
SODIUM: 136 meq/L (ref 135–145)

## 2014-09-11 MED ORDER — LISINOPRIL 10 MG PO TABS: 10.0000 mg | ORAL_TABLET | Freq: Every day | ORAL | Status: DC

## 2014-09-11 NOTE — Patient Instructions (Addendum)
Start taking 1/2 a pill of lisinopril until you run out then take full 10mg  pill.   Goal systolic (top #) between 440-102 Goal diastolic (bottom #) between 60-88  Call or message me if you get outside this range  Health Maintenance Due  Topic Date Due  . COLONOSCOPY - you agreed to getting this at age 58, i advised to get it now 09/17/2005

## 2014-09-11 NOTE — Assessment & Plan Note (Addendum)
BP lower than desired today. Titrate down to 10mg  of lisinopril from 20mg . Follow up in March. Patient given goal per AVS to contact us. Cr today to make sure <30% increase and potassium ok on bmet.

## 2014-09-11 NOTE — Progress Notes (Signed)
  Garret Reddish, MD Phone: [phone]  Subjective:   Louis Vaughn is a 58 y.o. year old very pleasant male patient who presents with the following:  Hypertension-good control, perhaps overcontrolled  BP Readings from Last 3 Encounters:  09/11/14 92/72  08/15/14 160/92  01/18/14 134/92   Home BP monitoring-yes extensive list provided with BP as low as in 80s SBP and typically higest in 023X, diastolics in 43H 68S mainly Compliant with medications-yes without side effects, took 1/2 pill on days with lower BP ROS-Denies any CP, HA, SOB, blurry vision, LE edema.   Past Medical History- Patient Active Problem List   Diagnosis Date Noted  . Essential hypertension, benign 09/11/2014   Medications- reviewed and updated Current Outpatient Prescriptions  Medication Sig Dispense Refill  . Glucosamine 750 MG TABS Take 3 tablets by mouth daily.    Marland Kitchen lisinopril (PRINIVIL,ZESTRIL) 10 MG tablet Take 1 tablet (10 mg total) by mouth daily. 90 tablet 3  . Ciclopirox 1 % shampoo Apply topically 2 (two) times daily.      No current facility-administered medications for this visit.    Objective: BP 92/72 mmHg  Temp(Src) 98 F (36.7 C)  Wt 149 lb (67.586 kg) Gen: NAD, resting comfortably in chair, appears younger than stated age CV: RRR no murmurs rubs or gallops Lungs: CTAB no crackles, wheeze, rhonchi Abdomen: soft/nontender/nondistended/normal bowel sounds.  Ext: no edema Skin: warm, dry, no rash Neuro: grossly normal, moves all extremities   Assessment/Plan:  Essential hypertension, benign BP lower than desired today. Titrate down to 10mg  of lisinopril from 20mg . Follow up in March. Patient given goal per AVS to contact us. Cr today to make sure <30% increase and potassium ok on bmet.   Declines colonoscopy until age 78 despite counselin.   Return precautions advised.   Orders Placed This Encounter  Procedures  . Basic metabolic panel    [place]   Meds ordered this  encounter  Medications  . lisinopril (PRINIVIL,ZESTRIL) 10 MG tablet    Sig: Take 1 tablet (10 mg total) by mouth daily.    Dispense:  90 tablet    Refill:  3

## 2015-01-16 ENCOUNTER — Encounter: Payer: Self-pay | Admitting: Family Medicine

## 2015-01-16 ENCOUNTER — Ambulatory Visit (INDEPENDENT_AMBULATORY_CARE_PROVIDER_SITE_OTHER): Payer: BC Managed Care – PPO | Admitting: Family Medicine

## 2015-01-16 VITALS — BP 100/64 | HR 61 | Temp 98.3°F | Wt 150.0 lb

## 2015-01-16 DIAGNOSIS — M1712 Unilateral primary osteoarthritis, left knee: Secondary | ICD-10-CM

## 2015-01-16 DIAGNOSIS — I10 Essential (primary) hypertension: Secondary | ICD-10-CM

## 2015-01-16 DIAGNOSIS — M1990 Unspecified osteoarthritis, unspecified site: Secondary | ICD-10-CM | POA: Insufficient documentation

## 2015-01-16 DIAGNOSIS — M179 Osteoarthritis of knee, unspecified: Secondary | ICD-10-CM

## 2015-01-16 MED ORDER — LISINOPRIL 5 MG PO TABS: 5.0000 mg | ORAL_TABLET | Freq: Every day | ORAL | Status: DC

## 2015-01-16 NOTE — Patient Instructions (Signed)
BP looks really good still. Lower your dose to 5mg .   Follow up with me in August or September for a physical/CPE.   Ask them when you come in for labs to add HIV for new national recommendations of 1x screen

## 2015-01-16 NOTE — Progress Notes (Signed)
  Louis Reddish, MD Phone: [phone]  Subjective:  Patient presents today to establish care with me as their new primary care provider. Patient was formerly a patient of Dr. Leanne Chang. Chief complaint-noted.   Hypertension-excellent control, perhaps overtreated on lisinopril 10mg   BP Readings from Last 3 Encounters:  01/16/15 100/64  09/11/14 92/72  08/15/14 160/92   Home BP monitoring-100s to 120s/60s or 70s Compliant with medications-yes without side effects ROS-Denies any CP, HA, SOB, blurry vision, LE edema, transient weakness, orthopnea, PND.   The following were reviewed and entered/updated in epic: Past Medical History  Diagnosis Date  . Hypertension   . Osteoarthritis of left knee   . Left elbow tendonitis    Patient Active Problem List   Diagnosis Date Noted  . Osteoarthritis of left knee 01/16/2015  . Essential hypertension, benign 09/11/2014   Past Surgical History  Procedure Laterality Date  . None      Family History  Problem Relation Age of Onset  . Hypertension Mother   . Sleep apnea Brother     Medications- reviewed and updated Current Outpatient Prescriptions  Medication Sig Dispense Refill  . Ciclopirox 1 % shampoo Apply topically 2 (two) times daily.     . Glucosamine 750 MG TABS Take 3 tablets by mouth daily.    Marland Kitchen lisinopril (PRINIVIL,ZESTRIL) 10 MG tablet Take 1 tablet (10 mg total) by mouth daily. 90 tablet 3   Allergies-reviewed and updated No Known Allergies  History   Social History  . Marital Status: Single    Spouse Name: N/A  . Number of Children: N/A  . Years of Education: N/A   Social History Main Topics  . Smoking status: Never Smoker   . Smokeless tobacco: Not on file  . Alcohol Use: 4.2 oz/week    7 Standard drinks or equivalent per week  . Drug Use: No  . Sexual Activity: Not on file   Other Topics Concern  . None   Social History Narrative   Married (not sure if wife a patient here). 1 daughter 45 lives in [place]  in 2016.       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 No rebound or guarding.  Ext: no edema, 2+ PT pulses Skin: warm, dry, no rash Neuro: grossly normal, moves all extremities, normal gait   Assessment/Plan:  Essential hypertension, benign Titrating down lisinopril as overly controlled. Cut to 5mg  from 10mg  at this visit.    Return precautions advised. Aug or September for CPE. Plan for HIV as part of labs for unprotected sex with wife.  Defers colonoscopy until age 59.   Meds ordered this encounter  Medications  . lisinopril (PRINIVIL,ZESTRIL) 5 MG tablet    Sig: Take 1 tablet (5 mg total) by mouth daily.    Dispense:  30 tablet    Refill:  5

## 2015-01-17 NOTE — Assessment & Plan Note (Signed)
Titrating down lisinopril as overly controlled. Cut to 5mg  from 10mg  at this visit.

## 2015-11-14 ENCOUNTER — Other Ambulatory Visit (INDEPENDENT_AMBULATORY_CARE_PROVIDER_SITE_OTHER): Payer: BC Managed Care – PPO

## 2015-11-14 ENCOUNTER — Encounter: Payer: Self-pay | Admitting: Family Medicine

## 2015-11-14 DIAGNOSIS — Z0000 Encounter for general adult medical examination without abnormal findings: Secondary | ICD-10-CM | POA: Diagnosis not present

## 2015-11-14 DIAGNOSIS — E785 Hyperlipidemia, unspecified: Secondary | ICD-10-CM | POA: Insufficient documentation

## 2015-11-14 LAB — TSH: TSH: 4.13 u[IU]/mL (ref 0.35–4.50)

## 2015-11-14 LAB — CBC WITH DIFFERENTIAL/PLATELET
Basophils Absolute: 0 10*3/uL (ref 0.0–0.1)
Basophils Relative: 0.3 % (ref 0.0–3.0)
Eosinophils Absolute: 0 10*3/uL (ref 0.0–0.7)
Eosinophils Relative: 0.7 % (ref 0.0–5.0)
HCT: 45.7 % (ref 39.0–52.0)
Hemoglobin: 15.2 g/dL (ref 13.0–17.0)
Lymphocytes Relative: 39.4 % (ref 12.0–46.0)
Lymphs Abs: 2.6 10*3/uL (ref 0.7–4.0)
MCHC: 33.3 g/dL (ref 30.0–36.0)
MCV: 89 fl (ref 78.0–100.0)
Monocytes Absolute: 0.7 10*3/uL (ref 0.1–1.0)
Monocytes Relative: 9.9 % (ref 3.0–12.0)
NEUTROS PCT: 49.7 % (ref 43.0–77.0)
Neutro Abs: 3.3 10*3/uL (ref 1.4–7.7)
PLATELETS: 357 10*3/uL (ref 150.0–400.0)
RBC: 5.13 Mil/uL (ref 4.22–5.81)
RDW: 12.6 % (ref 11.5–15.5)
WBC: 6.7 10*3/uL (ref 4.0–10.5)

## 2015-11-14 LAB — HEPATIC FUNCTION PANEL
ALBUMIN: 3.6 g/dL (ref 3.5–5.2)
ALK PHOS: 68 U/L (ref 39–117)
ALT: 14 U/L (ref 0–53)
AST: 16 U/L (ref 0–37)
Bilirubin, Direct: 0.1 mg/dL (ref 0.0–0.3)
TOTAL PROTEIN: 5.6 g/dL — AB (ref 6.0–8.3)
Total Bilirubin: 0.8 mg/dL (ref 0.2–1.2)

## 2015-11-14 LAB — BASIC METABOLIC PANEL
BUN: 14 mg/dL (ref 6–23)
CALCIUM: 9 mg/dL (ref 8.4–10.5)
CO2: 30 mEq/L (ref 19–32)
Chloride: 101 mEq/L (ref 96–112)
Creatinine, Ser: 0.98 mg/dL (ref 0.40–1.50)
GFR: 82.88 mL/min (ref >60.00)
Glucose, Bld: 95 mg/dL (ref 70–99)
POTASSIUM: 5.1 meq/L (ref 3.5–5.1)
SODIUM: 138 meq/L (ref 135–145)

## 2015-11-14 LAB — POCT URINALYSIS DIPSTICK
Bilirubin, UA: NEGATIVE
Blood, UA: NEGATIVE
Glucose, UA: NEGATIVE
Ketones, UA: NEGATIVE
LEUKOCYTES UA: NEGATIVE
NITRITE UA: NEGATIVE
PH UA: 5.5
PROTEIN UA: NEGATIVE
Spec Grav, UA: 1.02
Urobilinogen, UA: 0.2

## 2015-11-14 LAB — PSA: PSA: 2.29 ng/mL (ref 0.10–4.00)

## 2015-11-14 LAB — LIPID PANEL
CHOLESTEROL: 199 mg/dL (ref 0–200)
HDL: 51.1 mg/dL (ref >39.00)
LDL Cholesterol: 133 mg/dL — ABNORMAL HIGH (ref 0–99)
NONHDL: 147.46
Total CHOL/HDL Ratio: 4
Triglycerides: 72 mg/dL (ref 0.0–149.0)
VLDL: 14.4 mg/dL (ref 0.0–40.0)

## 2015-11-22 ENCOUNTER — Encounter: Payer: Self-pay | Admitting: Family Medicine

## 2015-11-22 ENCOUNTER — Ambulatory Visit (INDEPENDENT_AMBULATORY_CARE_PROVIDER_SITE_OTHER): Payer: BC Managed Care – PPO | Admitting: Family Medicine

## 2015-11-22 VITALS — BP 122/70 | HR 60 | Temp 98.8°F | Ht 68.0 in | Wt 151.0 lb

## 2015-11-22 DIAGNOSIS — J339 Nasal polyp, unspecified: Secondary | ICD-10-CM

## 2015-11-22 DIAGNOSIS — R972 Elevated prostate specific antigen [PSA]: Secondary | ICD-10-CM

## 2015-11-22 DIAGNOSIS — Z0000 Encounter for general adult medical examination without abnormal findings: Secondary | ICD-10-CM | POA: Diagnosis not present

## 2015-11-22 DIAGNOSIS — Z1211 Encounter for screening for malignant neoplasm of colon: Secondary | ICD-10-CM

## 2015-11-22 DIAGNOSIS — Z20828 Contact with and (suspected) exposure to other viral communicable diseases: Secondary | ICD-10-CM

## 2015-11-22 MED ORDER — FLUTICASONE PROPIONATE 50 MCG/ACT NA SUSP: 2.0000 | Freq: Every day | NASAL | Status: DC

## 2015-11-22 MED ORDER — LISINOPRIL 5 MG PO TABS: 5.0000 mg | ORAL_TABLET | Freq: Every day | ORAL | Status: DC

## 2015-11-22 NOTE — Progress Notes (Signed)
Louis Reddish, MD Phone: [phone]  Subjective:  Patient presents today for their annual physical. Chief complaint-noted.   See problem oriented charting- ROS- full  review of systems was completed and negative except for: No chest pain or shortness of breath. No headache or blurry vision. Sometimes feels fullness on left side of nares- more likely to feel like he cannot breath through this side  The following were reviewed and entered/updated in epic: Past Medical History  Diagnosis Date  . Hypertension   . Osteoarthritis of left knee   . Left elbow tendonitis    Patient Active Problem List   Diagnosis Date Noted  . Hyperlipidemia 11/14/2015  . Osteoarthritis of left knee 01/16/2015  . Essential hypertension, benign 09/11/2014   Past Surgical History  Procedure Laterality Date  . None      Family History  Problem Relation Age of Onset  . Hypertension Mother   . Sleep apnea Brother   . CVA Father     71    Medications- reviewed and updated Current Outpatient Prescriptions  Medication Sig Dispense Refill  . Glucosamine 750 MG TABS Take 3 tablets by mouth daily.    Marland Kitchen lisinopril (PRINIVIL,ZESTRIL) 5 MG tablet Take 1 tablet (5 mg total) by mouth daily. 90 tablet 3  . fluticasone (FLONASE) 50 MCG/ACT nasal spray Place 2 sprays into both nostrils daily. 16 g 6   No current facility-administered medications for this visit.    Allergies-reviewed and updated No Known Allergies  Social History   Social History  . Marital Status: Single    Spouse Name: N/A  . Number of Children: N/A  . Years of Education: N/A   Social History Main Topics  . Smoking status: Never Smoker   . Smokeless tobacco: None  . Alcohol Use: 4.2 oz/week    7 Standard drinks or equivalent per week  . Drug Use: No  . Sexual Activity: Not Asked   Other Topics Concern  . None   Social History Narrative   Married (not sure if wife a patient here). 1 daughter 23 lives in [place] in 2016.         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 Oropharynx normal Neck: no thyromegaly CV: RRR no murmurs rubs or gallops Lungs: CTAB no crackles, wheeze, rhonchi Abdomen: soft/nontender/nondistended/normal bowel sounds. No rebound or guarding.  Ext: no edema Skin: warm, dry Neuro: grossly normal, moves all extremities, PERRLA  Assessment/Plan:  60 y.o. male presenting for annual physical.  Health Maintenance counseling: 1. Anticipatory guidance: Patient counseled regarding regular dental exams, eye exams, wearing seatbelts.  2. Risk factor reduction:  Advised patient of need for regular exercise(about 4=6x a week) and diet rich and fruits and vegetables to reduce risk of heart attack and stroke.  3. Immunizations/screenings/ancillary studies Health Maintenance Due  Topic Date Due  . Hepatitis C Screening - add with repeat PSA  04/29/1956  . HIV Screening - add with repeat PSA  09/17/1970  . COLONOSCOPY - referred 09/17/2005  . ZOSTAVAX - states never had chicken pox 09/18/2015   4. Prostate cancer screening- BPH on exam with no nodules. Nocturia 0-2x a night. Suspect BPH as cause of  elevation- will get 6 month repeat and consider urology if needed  Lab Results  Component Value Date   PSA 2.29 11/14/2015   PSA 1.77 01/18/2014   PSA 1.46 11/27/2011   5. Colon cancer screening - referred for colonoscopy  Hypertension- controlled on lisinopril Hyperlipidemia- 9.1% 10 year risk - we opte dfor no statin at this time but if risk >10% consider Nasal polyps- On MRI from [place] 05/2015. Started on flonase  Return in about 6 months (around 05/21/2016) for labs. 1 year for physical. . Return  precautions advised.   Orders Placed This Encounter  Procedures  . PSA    Standing Status: Future     Number of Occurrences:      Standing Expiration Date: 11/21/2016  . Hepatitis C antibody, reflex    [facility]    Standing Status: Future     Number of Occurrences:      Standing Expiration Date: 11/21/2016  . HIV antibody    Standing Status: Future     Number of Occurrences:      Standing Expiration Date: 11/21/2016  . Ambulatory referral to Gastroenterology    Referral Priority:  Routine    Referral Type:  Consultation    Referral Reason:  Specialty Services Required    Number of Visits Requested:  1    Meds ordered this encounter  Medications  . lisinopril (PRINIVIL,ZESTRIL) 5 MG tablet    Sig: Take 1 tablet (5 mg total) by mouth daily.    Dispense:  90 tablet    Refill:  3  . fluticasone (FLONASE) 50 MCG/ACT nasal spray    Sig: Place 2 sprays into both nostrils daily.    Dispense:  16 g    Refill:  6

## 2015-11-22 NOTE — Patient Instructions (Addendum)
[facility] will call you regarding colonoscopy.  Schedule a visit at checkout for lab in 6 months (repeat PSA, test 1x screen HIV and hepatitis C). No sex or bike riding 3 days prior to this test please. Does not have to be fasting  The main concern about nasal polyps is symptoms of obstruction (congestion, feeling like you cannot breath out of one side). Nasal steroids help shrink polyps. Surgery can remove them but they often recur. I sent in a nasal steroid for you.

## 2015-12-25 ENCOUNTER — Encounter: Payer: Self-pay | Admitting: Internal Medicine

## 2016-06-04 ENCOUNTER — Encounter: Payer: Self-pay | Admitting: Gastroenterology

## 2016-07-23 ENCOUNTER — Ambulatory Visit: Payer: BC Managed Care – PPO | Admitting: *Deleted

## 2016-07-23 VITALS — Ht 67.0 in | Wt 148.4 lb

## 2016-07-23 DIAGNOSIS — Z1211 Encounter for screening for malignant neoplasm of colon: Secondary | ICD-10-CM

## 2016-07-23 MED ORDER — SUPREP BOWEL PREP KIT 17.5-3.13-1.6 GM/177ML PO SOLN: 1.0000 | Freq: Once | ORAL | 0 refills | Status: AC

## 2016-07-23 NOTE — Progress Notes (Signed)
Patient denies any allergies to egg or soy products. Patient denies complications with anesthesia/sedation.  Patient denies oxygen use at home and denies diet medications. Emmi instructions for colonoscopy explained and pamphlet given.

## 2016-07-24 ENCOUNTER — Encounter: Payer: Self-pay | Admitting: Gastroenterology

## 2016-08-06 ENCOUNTER — Encounter: Payer: Self-pay | Admitting: Gastroenterology

## 2016-08-06 ENCOUNTER — Ambulatory Visit (AMBULATORY_SURGERY_CENTER): Payer: BC Managed Care – PPO | Admitting: Gastroenterology

## 2016-08-06 VITALS — BP 116/74 | HR 55 | Temp 97.1°F | Resp 12 | Ht 68.0 in | Wt 151.0 lb

## 2016-08-06 DIAGNOSIS — Z1211 Encounter for screening for malignant neoplasm of colon: Secondary | ICD-10-CM | POA: Diagnosis not present

## 2016-08-06 DIAGNOSIS — D123 Benign neoplasm of transverse colon: Secondary | ICD-10-CM

## 2016-08-06 DIAGNOSIS — D122 Benign neoplasm of ascending colon: Secondary | ICD-10-CM | POA: Diagnosis not present

## 2016-08-06 MED ORDER — SODIUM CHLORIDE 0.9 % IV SOLN: 500.0000 mL | INTRAVENOUS | Status: DC

## 2016-08-06 NOTE — Progress Notes (Signed)
Pt was asleep when entering the recovery room.  I removed the nasal canula and he never moved.  Pt woke up just before Dr. Havery Moros came in to speak with the pt and his wife about the findings from his colonoscopy.  Pt also told Dr. Havery Moros that he did not go to sleep for the exam.  Dr. Havery Moros reassured pt and his wife that he was asleep through the entire colonoscopy.  maw

## 2016-08-06 NOTE — Patient Instructions (Signed)
YOU HAD AN ENDOSCOPIC PROCEDURE TODAY AT [facility]:   Refer to the procedure report that was given to you for any specific questions about what was found during the examination.  If the procedure report does not answer your questions, please call your gastroenterologist to clarify.  If you requested that your care partner not be given the details of your procedure findings, then the procedure report has been included in a sealed envelope for you to review at your convenience later.  YOU SHOULD EXPECT: Some feelings of bloating in the abdomen. Passage of more gas than usual.  Walking can help get rid of the air that was put into your GI tract during the procedure and reduce the bloating. If you had a lower endoscopy (such as a colonoscopy or flexible sigmoidoscopy) you may notice spotting of blood in your stool or on the toilet paper. If you underwent a bowel prep for your procedure, you may not have a normal bowel movement for a few days.  Please Note:  You might notice some irritation and congestion in your nose or some drainage.  This is from the oxygen used during your procedure.  There is no need for concern and it should clear up in a day or so.  SYMPTOMS TO REPORT IMMEDIATELY:   Following lower endoscopy (colonoscopy or flexible sigmoidoscopy):  Excessive amounts of blood in the stool  Significant tenderness or worsening of abdominal pains  Swelling of the abdomen that is new, acute  Fever of 100F or higher   Following upper endoscopy (EGD)  Vomiting of blood or coffee ground material  New chest pain or pain under the shoulder blades  Painful or persistently difficult swallowing  New shortness of breath  Fever of 100F or higher  Black, tarry-looking stools  For urgent or emergent issues, a gastroenterologist can be reached at any hour by calling [phone].   DIET:  We do recommend a small meal at first, but then you may proceed to your regular diet.  Drink  plenty of fluids but you should avoid alcoholic beverages for 24 hours.  ACTIVITY:  You should plan to take it easy for the rest of today and you should NOT DRIVE or use heavy machinery until tomorrow (because of the sedation medicines used during the test).    FOLLOW UP: Our staff will call the number listed on your records the next business day following your procedure to check on you and address any questions or concerns that you may have regarding the information given to you following your procedure. If we do not reach you, we will leave a message.  However, if you are feeling well and you are not experiencing any problems, there is no need to return our call.  We will assume that you have returned to your regular daily activities without incident.  If any biopsies were taken you will be contacted by phone or by letter within the next 1-3 weeks.  Please call us at [phone] if you have not heard about the biopsies in 3 weeks.    SIGNATURES/CONFIDENTIALITY: You and/or your care partner have signed paperwork which will be entered into your electronic medical record.  These signatures attest to the fact that that the information above on your After Visit Summary has been reviewed and is understood.  Full responsibility of the confidentiality of this discharge information lies with you and/or your care-partner.    Handouts were given to your care partner on polyps  and hemorrhoids. No aspirin, aspirin products,  ibuprofen, naproxen, advil, motrin, aleve, or other non-steroidal anti-inflammatory drugs for 14 days after polyp removal. You may resume your other current medications today. Await biopsy results. Please call if any questions or concerns.

## 2016-08-06 NOTE — Progress Notes (Signed)
No egg or soy allergy known to patient

## 2016-08-06 NOTE — Progress Notes (Signed)
To recovery repor to RN  VSS

## 2016-08-06 NOTE — Op Note (Signed)
[facility] Patient Name: Louis Vaughn Procedure Date: 08/06/2016 9:29 AM MRN: [id number] Endoscopist: Remo Lipps P. Havery Moros , MD Age: 60 Referring MD:  Date of Birth: 11/26/1955 Gender: Male Account #: [id number] Procedure:                Colonoscopy Indications:              Screening for malignant neoplasm in the colon, This                            is the patient's first colonoscopy Medicines:                Monitored Anesthesia Care Procedure:                Pre-Anesthesia Assessment:                           - Prior to the procedure, a History and Physical                            was performed, and patient medications and                            allergies were reviewed. The patient's tolerance of                            previous anesthesia was also reviewed. The risks                            and benefits of the procedure and the sedation                            options and risks were discussed with the patient.                            All questions were answered, and informed consent                            was obtained. Prior Anticoagulants: The patient has                            taken no previous anticoagulant or antiplatelet                            agents. ASA Grade Assessment: II - A patient with                            mild systemic disease. After reviewing the risks                            and benefits, the patient was deemed in                            satisfactory condition to undergo the procedure.  After obtaining informed consent, the colonoscope                            was passed under direct vision. Throughout the                            procedure, the patient's blood pressure, pulse, and                            oxygen saturations were monitored continuously. The                            Model CF-HQ190L [id number]) scope was introduced                            through the anus  and advanced to the the cecum,                            identified by appendiceal orifice and ileocecal                            valve. The colonoscopy was performed without                            difficulty. The patient tolerated the procedure                            well. The quality of the bowel preparation was                            good. The ileocecal valve, appendiceal orifice, and                            rectum were photographed. Scope In: 9:37:13 AM Scope Out: 10:04:49 AM Scope Withdrawal Time: 0 hours 24 minutes 22 seconds  Total Procedure Duration: 0 hours 27 minutes 36 seconds  Findings:                 The perianal and digital rectal examinations were                            normal.                           A single small angiodysplastic lesion was found at                            the hepatic flexure.                           A 5 mm polyp was found in the ascending colon. The                            polyp was flat. The polyp was removed with a cold  snare. Resection and retrieval were complete.                           A 12-15 mm polyp was found in the ascending colon.                            The polyp was flat and subtle. The polyp was                            removed with a cold snare. Resection and retrieval                            were complete.                           Two sessile polyps were found in the hepatic                            flexure. The polyps were 3 to 5 mm in size. These                            polyps were removed with a cold snare. Resection                            and retrieval were complete.                           A diminutive polyp was found in the transverse                            colon. The polyp was sessile. The polyp was removed                            with a cold biopsy forceps. Resection and retrieval                            were complete.                            Non-bleeding internal hemorrhoids were found during                            retroflexion.                           The exam was otherwise without abnormality. Complications:            No immediate complications. Estimated blood loss:                            Minimal. Estimated Blood Loss:     Estimated blood loss was minimal. Impression:               - A single colonic angiodysplastic lesion.                           -  One 5 mm polyp in the ascending colon, removed                            with a cold snare. Resected and retrieved.                           - One 15 mm polyp in the ascending colon, removed                            with a cold snare. Resected and retrieved.                           - Two 3 to 5 mm polyps at the hepatic flexure,                            removed with a cold snare. Resected and retrieved.                           - One diminutive polyp in the transverse colon,                            removed with a cold biopsy forceps. Resected and                            retrieved.                           - Non-bleeding internal hemorrhoids.                           - The examination was otherwise normal. Recommendation:           - Patient has a contact number available for                            emergencies. The signs and symptoms of potential                            delayed complications were discussed with the                            patient. Return to normal activities tomorrow.                            Written discharge instructions were provided to the                            patient.                           - Resume previous diet.                           - Continue present medications.                           -  No aspirin, ibuprofen, naproxen, or other                            non-steroidal anti-inflammatory drugs for 2 weeks                            after polyp removal.                           - Await  pathology results.                           - Repeat colonoscopy is recommended for                            surveillance. The colonoscopy date will be                            determined after pathology results from today's                            exam become available for review. Remo Lipps P. Armbruster, MD 08/06/2016 10:10:51 AM This report has been signed electronically.

## 2016-08-06 NOTE — Progress Notes (Signed)
Called to room to assist during endoscopic procedure.  Patient ID and intended procedure confirmed with present staff. Received instructions for my participation in the procedure from the performing physician.  

## 2016-08-06 NOTE — Progress Notes (Signed)
Pt requested a copy of the report to take to his md next week.  Copy given to pt.  Also asked for a copy of his vital signs while he was in the recovery room.  Given to pt's wife.  Maw No problems noted in the recovery room. maw

## 2016-08-07 ENCOUNTER — Telehealth: Payer: Self-pay | Admitting: *Deleted

## 2016-08-07 NOTE — Telephone Encounter (Signed)
  Follow up Call-  Call back number 08/06/2016  Post procedure Call Back phone  # [phone]  Permission to leave phone message Yes  Some recent data might be hidden     Patient questions:  Do you have a fever, pain , or abdominal swelling? No. Pain Score  0 *  Have you tolerated food without any problems? Yes.    Have you been able to return to your normal activities? Yes.    Do you have any questions about your discharge instructions: Diet   No. Medications  No. Follow up visit  No.  Do you have questions or concerns about your Care? No.  Actions: * If pain score is 4 or above: No action needed, pain <4.

## 2016-08-11 ENCOUNTER — Ambulatory Visit (INDEPENDENT_AMBULATORY_CARE_PROVIDER_SITE_OTHER): Payer: BC Managed Care – PPO | Admitting: Family Medicine

## 2016-08-11 ENCOUNTER — Encounter: Payer: Self-pay | Admitting: Family Medicine

## 2016-08-11 ENCOUNTER — Other Ambulatory Visit: Payer: Self-pay | Admitting: Family Medicine

## 2016-08-11 VITALS — BP 138/72 | HR 59 | Temp 97.9°F | Wt 145.6 lb

## 2016-08-11 DIAGNOSIS — E785 Hyperlipidemia, unspecified: Secondary | ICD-10-CM | POA: Diagnosis not present

## 2016-08-11 DIAGNOSIS — R972 Elevated prostate specific antigen [PSA]: Secondary | ICD-10-CM | POA: Diagnosis not present

## 2016-08-11 DIAGNOSIS — Z20828 Contact with and (suspected) exposure to other viral communicable diseases: Secondary | ICD-10-CM

## 2016-08-11 DIAGNOSIS — I10 Essential (primary) hypertension: Secondary | ICD-10-CM

## 2016-08-11 LAB — PSA: PSA: 2.93 ng/mL (ref 0.10–4.00)

## 2016-08-11 NOTE — Assessment & Plan Note (Signed)
S: mild poorly controlled on no medication. No myalgias. 4-6x a week excercise Lab Results  Component Value Date   CHOL 199 11/14/2015   HDL 51.10 11/14/2015   LDLCALC 133 (H) 11/14/2015   TRIG 72.0 11/14/2015   CHOLHDL 4 11/14/2015   A/P: discussed high plant based diet. Continue excellent exercise. Has lost 6 lbs- follow up at physical with repeat labs

## 2016-08-11 NOTE — Progress Notes (Signed)
Pre visit review using our clinic review tool, if applicable. No additional management support is needed unless otherwise documented below in the visit note. 

## 2016-08-11 NOTE — Patient Instructions (Signed)
Labs before you leave  Blood pressure looks good on repeat

## 2016-08-11 NOTE — Assessment & Plan Note (Signed)
S: controlled on lisinopril 5mg . At home #s in 120s or 130s.  BP Readings from Last 3 Encounters:  08/11/16 138/72  08/06/16 116/74  11/22/15 122/70  A/P:Continue current meds:  Doing well

## 2016-08-11 NOTE — Assessment & Plan Note (Signed)
Lab Results  Component Value Date   PSA 2.29 11/14/2015   PSA 1.77 01/18/2014   PSA 1.46 11/27/2011  slow steady rise of PSA- has been 9 months- will update again. BPH as likely cause as noted last exam. PSA rise overall around 0.2 a year could be explained by BPH but consider urology visit if above 4 or if more rapidly increases. Repeat PSA today

## 2016-08-11 NOTE — Progress Notes (Signed)
Subjective:  Louis Vaughn is a 60 y.o. year old very pleasant male patient who presents for/with See problem oriented charting ROS- No chest pain or shortness of breath. No headache or blurry vision. No unintentional weight loss, nocturia 0-2x a night.see any ROS included in HPI as well.   Past Medical History-  Patient Active Problem List   Diagnosis Date Noted  . Hyperlipidemia 11/14/2015    Priority: Medium  . Essential hypertension, benign 09/11/2014    Priority: Medium  . Nasal polyps 11/22/2015    Priority: Low  . Osteoarthritis of left knee 01/16/2015    Priority: Low  . Elevated PSA, less than 10 ng/ml 08/11/2016    Medications- reviewed and updated Current Outpatient Prescriptions  Medication Sig Dispense Refill  . fluticasone (FLONASE) 50 MCG/ACT nasal spray Place 2 sprays into both nostrils daily. 16 g 6  . Glucosamine 750 MG TABS Take 3 tablets by mouth daily.    Marland Kitchen lisinopril (PRINIVIL,ZESTRIL) 5 MG tablet Take 1 tablet (5 mg total) by mouth daily. 90 tablet 3  . Multiple Vitamin (MULTIVITAMIN) tablet Take 1 tablet by mouth daily.     No current facility-administered medications for this visit.     Objective: BP 138/72   Pulse (!) 59   Temp 97.9 F (36.6 C) (Oral)   Wt 145 lb 9.6 oz (66 kg)   SpO2 96%   BMI 22.14 kg/m  Gen: NAD, resting comfortably CV: RRR no murmurs rubs or gallops Lungs: CTAB no crackles, wheeze, rhonchi Normal weight Ext: no edema Skin: warm, dry, no rash  Assessment/Plan:  Essential hypertension, benign S: controlled on lisinopril 5mg . At home #s in 120s or 130s.  BP Readings from Last 3 Encounters:  08/11/16 138/72  08/06/16 116/74  11/22/15 122/70  A/P:Continue current meds:  Doing well  Hyperlipidemia S: mild poorly controlled on no medication. No myalgias. 4-6x a week excercise Lab Results  Component Value Date   CHOL 199 11/14/2015   HDL 51.10 11/14/2015   LDLCALC 133 (H) 11/14/2015   TRIG 72.0 11/14/2015   CHOLHDL 4 11/14/2015   A/P: discussed high plant based diet. Continue excellent exercise. Has lost 6 lbs- follow up at physical with repeat labs  Elevated PSA, less than 10 ng/ml Lab Results  Component Value Date   PSA 2.29 11/14/2015   PSA 1.77 01/18/2014   PSA 1.46 11/27/2011  slow steady rise of PSA- has been 9 months- will update again. BPH as likely cause as noted last exam. PSA rise overall around 0.2 a year could be explained by BPH but consider urology visit if above 4 or if more rapidly increases. Repeat PSA today  Within 6 months for CPE - verbal discussed prior  Return precautions advised.  Garret Reddish, MD

## 2016-08-12 ENCOUNTER — Other Ambulatory Visit: Payer: Self-pay

## 2016-08-12 DIAGNOSIS — R972 Elevated prostate specific antigen [PSA]: Secondary | ICD-10-CM

## 2016-08-12 LAB — HIV ANTIBODY (ROUTINE TESTING W REFLEX): HIV: NONREACTIVE

## 2016-08-12 LAB — HEPATITIS C ANTIBODY: HCV Ab: NEGATIVE

## 2016-08-14 ENCOUNTER — Encounter: Payer: Self-pay | Admitting: Gastroenterology

## 2016-10-23 ENCOUNTER — Telehealth: Payer: Self-pay | Admitting: Family Medicine

## 2016-10-23 NOTE — Telephone Encounter (Signed)
Spoke with patient. He though his cholesterol was being reassessed in October but I explained it was his PSA. Patient verbalized understanding

## 2016-10-23 NOTE — Telephone Encounter (Signed)
Pt would like results of labs done 08/11/2016 if you can call back please.

## 2016-11-11 ENCOUNTER — Other Ambulatory Visit: Payer: Self-pay | Admitting: Family Medicine

## 2017-08-16 ENCOUNTER — Other Ambulatory Visit: Payer: Self-pay | Admitting: Family Medicine

## 2017-11-19 ENCOUNTER — Other Ambulatory Visit: Payer: Self-pay | Admitting: Family Medicine

## 2017-11-26 ENCOUNTER — Other Ambulatory Visit: Payer: Self-pay | Admitting: Family Medicine

## 2017-11-26 MED ORDER — LISINOPRIL 5 MG PO TABS: ORAL_TABLET | ORAL | 0 refills | Status: DC

## 2017-11-26 NOTE — Telephone Encounter (Signed)
See note

## 2017-11-26 NOTE — Telephone Encounter (Signed)
Copied from [place] [id number]. Topic: Quick Communication - Rx Refill/Question >> Nov 26, 2017 12:45 PM Oliver Pila B wrote: Medication: lisinopril (PRINIVIL,ZESTRIL) 5 MG tablet [[id number]]    Has the patient contacted their pharmacy? Yes.     (Agent: If no, request that the patient contact the pharmacy for the refill.)   Preferred Pharmacy (with phone number or street name): [facility]   Agent: Please be advised that RX refills may take up to 3 business days. We ask that you follow-up with your pharmacy.

## 2017-11-26 NOTE — Telephone Encounter (Signed)
Rx refill sent to pharmacy. Pt has physical in April.

## 2017-12-16 LAB — PSA: PSA: 1.95

## 2017-12-25 ENCOUNTER — Encounter: Payer: Self-pay | Admitting: Family Medicine

## 2018-02-11 ENCOUNTER — Ambulatory Visit (INDEPENDENT_AMBULATORY_CARE_PROVIDER_SITE_OTHER): Payer: BC Managed Care – PPO | Admitting: Family Medicine

## 2018-02-11 ENCOUNTER — Encounter: Payer: Self-pay | Admitting: Family Medicine

## 2018-02-11 VITALS — BP 128/86 | HR 56 | Temp 97.8°F | Ht 68.0 in | Wt 152.6 lb

## 2018-02-11 DIAGNOSIS — Z0000 Encounter for general adult medical examination without abnormal findings: Secondary | ICD-10-CM

## 2018-02-11 DIAGNOSIS — E785 Hyperlipidemia, unspecified: Secondary | ICD-10-CM | POA: Diagnosis not present

## 2018-02-11 NOTE — Patient Instructions (Signed)
Schedule a lab visit at the check out desk within 2 weeks. Return for future fasting labs meaning nothing but water after midnight please. Ok to take your medications with water.   Go ahead and schedule your physical in 1 year  Glad you are doing so well!

## 2018-02-11 NOTE — Progress Notes (Signed)
Phone: [phone]  Subjective:  Patient presents today for their annual physical. Chief complaint-noted.   See problem oriented charting- ROS- full  review of systems was completed and negative except for: dental problems, wearing glasses, right elbow pain  The following were reviewed and entered/updated in epic: Past Medical History:  Diagnosis Date  . Allergy   . Hypertension   . Left elbow tendonitis   . Osteoarthritis of left knee    knees   Patient Active Problem List   Diagnosis Date Noted  . Hyperlipidemia 11/14/2015    Priority: Medium  . Essential hypertension, benign 09/11/2014    Priority: Medium  . Nasal polyps 11/22/2015    Priority: Low  . Osteoarthritis of left knee 01/16/2015    Priority: Low  . Elevated PSA, less than 10 ng/ml 08/11/2016   Past Surgical History:  Procedure Laterality Date  . none    . WISDOM TOOTH EXTRACTION      Family History  Problem Relation Age of Onset  . Hypertension Mother   . CVA Father        74  . Sleep apnea Brother   . Colon cancer Neg Hx   . Esophageal cancer Neg Hx   . Rectal cancer Neg Hx   . Stomach cancer Neg Hx   . Colon polyps Neg Hx     Medications- reviewed and updated Current Outpatient Medications  Medication Sig Dispense Refill  . Diclofenac Sodium (PENNSAID) 2 % SOLN Apply 2 Pump topically 2 (two) times daily.    . Glucosamine 750 MG TABS Take 3 tablets by mouth daily.    Marland Kitchen lisinopril (PRINIVIL,ZESTRIL) 5 MG tablet TAKE 1 TABLET(5 MG) BY MOUTH DAILY 90 tablet 0  . Multiple Vitamin (MULTIVITAMIN) tablet Take 1 tablet by mouth daily.     No current facility-administered medications for this visit.     Allergies-reviewed and updated No Known Allergies  Social History   Social History Narrative   Married (not sure if wife a patient here). 1 daughter 77 lives in [place] in 2016.       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 Oropharynx normal Neck: no thyromegaly CV: RRR no murmurs rubs or gallops Lungs: CTAB no crackles, wheeze, rhonchi Abdomen: soft/nontender/nondistended/normal bowel sounds. No rebound or guarding.  Ext: no edema Skin: warm, dry Neuro: grossly normal, moves all extremities, PERRLA  Rectal with urology  Assessment/Plan:  62 y.o. male presenting for annual physical.  Health Maintenance counseling: 1. Anticipatory guidance: Patient counseled regarding regular dental exams -q6 months, eye exams -yearly, wearing seatbelts.  2. Risk factor reduction:  Advised patient of need for regular exercise and diet rich and fruits and vegetables to reduce risk of heart attack and stroke. Exercise- [employer] at least 3 days a week. Diet-reasonable diet.  Wt Readings from Last 3 Encounters:  02/11/18 152 lb 9.6 oz (69.2 kg)  08/11/16 145 lb 9.6 oz (66 kg)  08/06/16 151 lb (68.5 kg)  3. Immunizations/screenings/ancillary studies- discussed shingrix at pharmacy or here next year hopefully  Immunization History  Administered  Date(s) Administered  . Td 06/14/2009  4. Prostate cancer screening-   PSA has gone down on last check. He is following with urology due to prior elevatoins. Not able to pull up reports today from [facility] to review fully Lab Results  Component Value Date   PSA 1.95 12/16/2017   PSA 2.93 08/11/2016   PSA 2.29 11/14/2015   5. Colon cancer screening -  08/06/16 colonoscopy. Did have adenoma- 3 year repeat due to multiple polyps 6. Skin cancer screening- sees dermatology every few years. advised regular sunscreen use. Denies worrisome, changing, or new skin lesions.    Status of chronic or acute concerns  HTN- controlled on low dose lisinopril  HLD- update lipids- had held off on statin- considering if risk >10-12%  On flonase for nasal polyps in past- no recent issues  Will skip urine as had with urology  Had a right elbow injury. Has done PT. Using sleeve on arm with workouts.   Future Appointments  Date Time Provider [place]  02/23/2018  9:00 AM [facility]  02/14/2019  8:45 AM Marin Olp, MD [facility]   Lab/Order associations: Hyperlipidemia, unspecified hyperlipidemia type - Plan: Lipid panel, CBC, Comprehensive metabolic panel  Return precautions advised.  Garret Reddish, MD

## 2018-02-23 ENCOUNTER — Other Ambulatory Visit: Payer: Self-pay | Admitting: Family Medicine

## 2018-02-23 ENCOUNTER — Other Ambulatory Visit (INDEPENDENT_AMBULATORY_CARE_PROVIDER_SITE_OTHER): Payer: BC Managed Care – PPO

## 2018-02-23 DIAGNOSIS — E785 Hyperlipidemia, unspecified: Secondary | ICD-10-CM

## 2018-02-23 LAB — LIPID PANEL
Cholesterol: 168 mg/dL (ref 0–200)
HDL: 64.8 mg/dL (ref >39.00)
LDL Cholesterol: 95 mg/dL (ref 0–99)
NONHDL: 103.55
Total CHOL/HDL Ratio: 3
Triglycerides: 44 mg/dL (ref 0.0–149.0)
VLDL: 8.8 mg/dL (ref 0.0–40.0)

## 2018-02-23 LAB — COMPREHENSIVE METABOLIC PANEL
ALT: 19 U/L (ref 0–53)
AST: 23 U/L (ref 0–37)
Albumin: 3.9 g/dL (ref 3.5–5.2)
Alkaline Phosphatase: 52 U/L (ref 39–117)
BUN: 9 mg/dL (ref 6–23)
CHLORIDE: 104 meq/L (ref 96–112)
CO2: 31 meq/L (ref 19–32)
Calcium: 9.2 mg/dL (ref 8.4–10.5)
Creatinine, Ser: 0.89 mg/dL (ref 0.40–1.50)
GFR: 91.93 mL/min (ref >60.00)
GLUCOSE: 92 mg/dL (ref 70–99)
Potassium: 4.8 mEq/L (ref 3.5–5.1)
SODIUM: 139 meq/L (ref 135–145)
Total Bilirubin: 0.7 mg/dL (ref 0.2–1.2)
Total Protein: 6 g/dL (ref 6.0–8.3)

## 2018-02-23 LAB — CBC
HEMATOCRIT: 44.3 % (ref 39.0–52.0)
HEMOGLOBIN: 15.2 g/dL (ref 13.0–17.0)
MCHC: 34.3 g/dL (ref 30.0–36.0)
MCV: 91.1 fl (ref 78.0–100.0)
Platelets: 201 10*3/uL (ref 150.0–400.0)
RBC: 4.87 Mil/uL (ref 4.22–5.81)
RDW: 12.6 % (ref 11.5–15.5)
WBC: 7.2 10*3/uL (ref 4.0–10.5)

## 2018-02-23 NOTE — Progress Notes (Signed)
Your CBC was normal (blood counts, infection fighting cells, platelets). Your CMET was normal (kidney, liver, and electrolytes, blood sugar).  Your cholesterol looks great- much improved with your healthy lifestyle changes over the last year.

## 2018-08-24 ENCOUNTER — Ambulatory Visit (INDEPENDENT_AMBULATORY_CARE_PROVIDER_SITE_OTHER): Payer: BC Managed Care – PPO | Admitting: Family Medicine

## 2018-08-24 ENCOUNTER — Encounter: Payer: Self-pay | Admitting: Family Medicine

## 2018-08-24 VITALS — BP 116/80 | HR 78 | Temp 98.1°F | Ht 68.0 in | Wt 149.4 lb

## 2018-08-24 DIAGNOSIS — B9689 Other specified bacterial agents as the cause of diseases classified elsewhere: Secondary | ICD-10-CM | POA: Diagnosis not present

## 2018-08-24 DIAGNOSIS — J329 Chronic sinusitis, unspecified: Secondary | ICD-10-CM | POA: Diagnosis not present

## 2018-08-24 MED ORDER — AMOXICILLIN-POT CLAVULANATE 875-125 MG PO TABS: 1.0000 | ORAL_TABLET | Freq: Two times a day (BID) | ORAL | 0 refills | Status: AC

## 2018-08-24 NOTE — Progress Notes (Signed)
PCP: Marin Olp, MD  Subjective:  Louis Vaughn is a 62 y.o. year old very pleasant male patient who presents with sinusitis symptoms including nasal congestion, sinus tenderness -other symptoms include: low grade temperature at first, brown discharge from nose at first- now all green - before all this started had dental implant procedure- his tooth feels great now with no issues.  -day of illness:10 -Symptoms with only mild improvement -previous treatments: some over the counter products- including one with mint- only minimal relief -sick contacts/travel/risks: denies flu exposure. Works in school system so several sick contacts -Hx of: allergies  ROS-denies measured fever, SOB, NVD.  tooth pain now resolved  Pertinent Past Medical History-  Patient Active Problem List   Diagnosis Date Noted  . Hyperlipidemia 11/14/2015    Priority: Medium  . Essential hypertension, benign 09/11/2014    Priority: Medium  . Nasal polyps 11/22/2015    Priority: Low  . Osteoarthritis of left knee 01/16/2015    Priority: Low  . Elevated PSA, less than 10 ng/ml 08/11/2016    Medications- reviewed  Current Outpatient Medications  Medication Sig Dispense Refill  . Glucosamine 750 MG TABS Take 3 tablets by mouth daily.    Marland Kitchen ketoconazole (NIZORAL) 2 % shampoo ketoconazole 2 % shampoo    . lisinopril (PRINIVIL,ZESTRIL) 5 MG tablet TAKE 1 TABLET BY MOUTH DAILY 90 tablet 2  . Multiple Vitamin (MULTIVITAMIN) tablet Take 1 tablet by mouth daily.      Objective: BP 116/80 (BP Location: Left Arm, Patient Position: Sitting, Cuff Size: Large)   Pulse 78   Temp 98.1 F (36.7 C) (Oral)   Ht 5\' 8"  (1.727 m)   Wt 149 lb 6.4 oz (67.8 kg)   SpO2 97%   BMI 22.72 kg/m  Gen: NAD, resting comfortably HEENT: Turbinates erythematous with yellow drainage, TM normal, pharynx mildly erythematous with no tonsilar exudate or edema, bilateral R> L maxillary sinus tenderness CV: RRR no murmurs rubs or  gallops Lungs: CTAB no crackles, wheeze, rhonchi Ext: no edema Skin: warm, dry, no rash Neuro: grossly normal, moves all extremities  Assessment/Plan:  Sinsusitis Bacterial based on: Symptoms >10 days, double sickening, or severe symptoms in first 3 days  Treatment: -considered steroid: we opted out for now -other symptomatic care with mucinex to help loosen congestion -Antibiotic indicated: yes- augmentin for 7 days  Afrin nasal spray may help but could raise your blood pressure so want you to avoid this  I would also like you to see your dentist if you are not improving by early next week to make sure dental implant not involved with illness   Finally, we reviewed reasons to return to care including if symptoms worsen or persist or new concerns arise (particularly fever or shortness of breath)  Meds ordered this encounter  Medications  . amoxicillin-clavulanate (AUGMENTIN) 875-125 MG tablet    Sig: Take 1 tablet by mouth 2 (two) times daily for 7 days.    Dispense:  14 tablet    Refill:  0    Garret Reddish, MD

## 2018-08-24 NOTE — Patient Instructions (Addendum)
Health Maintenance Due  Topic Date Due  . INFLUENZA VACCINE - would love for you to get this when you are feeling better 06/03/2018    Sinsusitis Bacterial based on: Symptoms >10 days, double sickening, or severe symptoms in first 3 days  Treatment: -considered steroid: we opted out for now -other symptomatic care with mucinex to help loosen congestion -Antibiotic indicated: yes- augmentin for 7 days  Afrin nasal spray may help but could raise your blood pressure so want you to avoid this  Finally, we reviewed reasons to return to care including if symptoms worsen or persist or new concerns arise (particularly fever or shortness of breath)  I would also like you to see your dentist if you are not improving by early next week to make sure dental implant not involved with illness

## 2018-11-10 ENCOUNTER — Ambulatory Visit: Payer: BC Managed Care – PPO | Admitting: Family Medicine

## 2018-11-26 ENCOUNTER — Other Ambulatory Visit: Payer: Self-pay | Admitting: Family Medicine

## 2018-12-02 ENCOUNTER — Other Ambulatory Visit: Payer: Self-pay | Admitting: Dermatology

## 2019-02-14 ENCOUNTER — Encounter: Payer: BC Managed Care – PPO | Admitting: Family Medicine

## 2019-05-20 ENCOUNTER — Ambulatory Visit (INDEPENDENT_AMBULATORY_CARE_PROVIDER_SITE_OTHER): Payer: BC Managed Care – PPO | Admitting: Family Medicine

## 2019-05-20 ENCOUNTER — Encounter: Payer: Self-pay | Admitting: Family Medicine

## 2019-05-20 ENCOUNTER — Other Ambulatory Visit: Payer: Self-pay

## 2019-05-20 VITALS — BP 126/84 | HR 62 | Temp 98.5°F | Ht 68.0 in | Wt 150.0 lb

## 2019-05-20 DIAGNOSIS — E785 Hyperlipidemia, unspecified: Secondary | ICD-10-CM

## 2019-05-20 DIAGNOSIS — Z125 Encounter for screening for malignant neoplasm of prostate: Secondary | ICD-10-CM | POA: Diagnosis not present

## 2019-05-20 DIAGNOSIS — Z0000 Encounter for general adult medical examination without abnormal findings: Secondary | ICD-10-CM | POA: Diagnosis not present

## 2019-05-20 DIAGNOSIS — I10 Essential (primary) hypertension: Secondary | ICD-10-CM

## 2019-05-20 LAB — COMPREHENSIVE METABOLIC PANEL
ALT: 25 U/L (ref 0–53)
AST: 23 U/L (ref 0–37)
Albumin: 4.2 g/dL (ref 3.5–5.2)
Alkaline Phosphatase: 49 U/L (ref 39–117)
BUN: 20 mg/dL (ref 6–23)
CO2: 30 mEq/L (ref 19–32)
Calcium: 9 mg/dL (ref 8.4–10.5)
Chloride: 105 mEq/L (ref 96–112)
Creatinine, Ser: 0.95 mg/dL (ref 0.40–1.50)
GFR: 79.9 mL/min (ref >60.00)
Glucose, Bld: 92 mg/dL (ref 70–99)
Potassium: 4.8 mEq/L (ref 3.5–5.1)
Sodium: 142 mEq/L (ref 135–145)
Total Bilirubin: 0.6 mg/dL (ref 0.2–1.2)
Total Protein: 6.2 g/dL (ref 6.0–8.3)

## 2019-05-20 LAB — CBC
HCT: 43.1 % (ref 39.0–52.0)
Hemoglobin: 14.4 g/dL (ref 13.0–17.0)
MCHC: 33.5 g/dL (ref 30.0–36.0)
MCV: 92.3 fl (ref 78.0–100.0)
Platelets: 208 10*3/uL (ref 150.0–400.0)
RBC: 4.66 Mil/uL (ref 4.22–5.81)
RDW: 12.4 % (ref 11.5–15.5)
WBC: 6 10*3/uL (ref 4.0–10.5)

## 2019-05-20 LAB — LIPID PANEL
Cholesterol: 214 mg/dL — ABNORMAL HIGH (ref 0–200)
HDL: 59.5 mg/dL (ref >39.00)
LDL Cholesterol: 145 mg/dL — ABNORMAL HIGH (ref 0–99)
NonHDL: 154.19
Total CHOL/HDL Ratio: 4
Triglycerides: 47 mg/dL (ref 0.0–149.0)
VLDL: 9.4 mg/dL (ref 0.0–40.0)

## 2019-05-20 LAB — PSA: PSA: 1.69 ng/mL (ref 0.10–4.00)

## 2019-05-20 MED ORDER — LISINOPRIL 5 MG PO TABS: 5.0000 mg | ORAL_TABLET | Freq: Every day | ORAL | 3 refills | Status: DC

## 2019-05-20 NOTE — Patient Instructions (Addendum)
Please stop by lab before you go If you do not have mychart- we will call you about results within 5 business days of [place] receiving them.  If you have mychart- we will send your results within 3 business days of [place] receiving them.  If abnormal or we want to clarify a result, we will call or mychart you to make sure you receive the message.  If you have questions or concerns or don't hear within 5-7 days, please send [place] a message or call us.     [email or web address]   Good to go as long as ekg looks good- Theadora Rama will be in to do this fo ryou

## 2019-05-20 NOTE — Progress Notes (Signed)
Phone: [phone]   Subjective:  Patient presents today for their annual physical. Chief complaint-noted.   See problem oriented charting- ROS- full  review of systems was completed and negative including No chest pain or shortness of breath. No headache or blurry vision.   The following were reviewed and entered/updated in epic: Past Medical History:  Diagnosis Date  . Allergy   . Hypertension   . Left elbow tendonitis   . Osteoarthritis of left knee    knees   Patient Active Problem List   Diagnosis Date Noted  . Hyperlipidemia 11/14/2015    Priority: Medium  . Essential hypertension, benign 09/11/2014    Priority: Medium  . Nasal polyps 11/22/2015    Priority: Low  . Osteoarthritis of left knee 01/16/2015    Priority: Low  . Elevated PSA, less than 10 ng/ml 08/11/2016   Past Surgical History:  Procedure Laterality Date  . none    . WISDOM TOOTH EXTRACTION      Family History  Problem Relation Age of Onset  . Hypertension Mother   . CVA Father        24  . Sleep apnea Brother   . Colon cancer Neg Hx   . Esophageal cancer Neg Hx   . Rectal cancer Neg Hx   . Stomach cancer Neg Hx   . Colon polyps Neg Hx     Medications- reviewed and updated Current Outpatient Medications  Medication Sig Dispense Refill  . Glucosamine 750 MG TABS Take 3 tablets by mouth daily.    Marland Kitchen ketoconazole (NIZORAL) 2 % shampoo ketoconazole 2 % shampoo    . lisinopril (ZESTRIL) 5 MG tablet Take 1 tablet (5 mg total) by mouth daily. 91 tablet 3  . Multiple Vitamin (MULTIVITAMIN) tablet Take 1 tablet by mouth daily.     No current facility-administered medications for this visit.     Allergies-reviewed and updated No Known Allergies  Social History   Social History Narrative   Married (not sure if wife a patient here). 1 daughter 81 lives in [place] in 2016.       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 Oropharynx normal Neck: no thyromegaly or cervical lymphadenopathy CV: RRR no murmurs rubs or gallops Lungs: CTAB no crackles, wheeze, rhonchi Abdomen: soft/nontender/nondistended/normal bowel sounds. No rebound or guarding.  Ext: no edema and 2+ PT pulses Skin: warm, dry Neuro: grossly normal, moves all extremities, PERRLA   EKG: sinus bradycardia with rate 57, normal axis, normal intervals, no hypertrophy, no st or t wave chages other than inverted isolated t wave in v2.     Assessment and Plan  63 y.o. male presenting for annual physical.  Health Maintenance counseling: 1. Anticipatory guidance: Patient counseled regarding regular dental exams -q6 months, eye exams -yearly,  avoiding smoking and second hand smoke , limiting alcohol to 2 beverages per day - 6 per week.   2. Risk factor reduction:  Advised patient of need for regular exercise and diet rich and fruits and vegetables to reduce risk of heart attack and stroke. Exercise-  doing [employer] 3 days a week right now with outdoor classes. Diet-reasonably healthy diet.  Wt Readings from Last 3 Encounters:  05/20/19 150 lb (68 kg)  08/24/18 149 lb 6.4 oz (67.8 kg)  02/11/18 152 lb 9.6 oz (69.2 kg)  3. Immunizations/screenings/ancillary studies- discussed shingrix (he does not think he had chicken pox) and discussed can still receive shingrix- he declines. Tdap today advised- he defers for now but knows if he gets a cut/scrape/significant bug bite to come by for update Immunization History  Administered Date(s) Administered  . Td 06/14/2009  4. Prostate cancer screening- PSA went down last few checks. Still following with urology- asks me to  check today- history elevated PSA in past.  Lab Results  Component Value Date   PSA 1.95 12/16/2017   PSA 2.93 08/11/2016   PSA 2.29 11/14/2015   5. Colon cancer screening - 08/2016 with 3 year follow up - will be due in October of this year 6. Skin cancer screening- sees dermatologist- saw earlier this year- no cancers or precancers. advised regular sunscreen use. Denies worrisome, changing, or new skin lesions.  7. never smoker  Status of chronic or acute concerns  Hypertension - Taking Lisinopril 5 mg daily. Checks BP at [facility] occasionally, staying consistently below 140/90.  - requests updated EKG (we do not have one on file) and I think that's very appropriate  Hyperlipidemia - mildly elevated in past- update with labs today  Going from here to [place] to [place] in September to see 59 year old mother gave him website with free testing websites as he needs to be tested before he leaves - I may write a letter after he gets free testing  Recommended follow up: 1 year physical as long as checks BP at home/[facility]  Lab/Order associations: fasting    ICD-10-CM   1. Preventative health care  Z00.00 CBC    Comprehensive metabolic panel    Lipid panel    PSA  2. Hyperlipidemia, unspecified hyperlipidemia type  E78.5 CBC    Comprehensive metabolic panel    Lipid panel  3. Essential hypertension, benign  I10 CBC    Comprehensive metabolic panel    Lipid panel    EKG 12-Lead  4. Screening for prostate cancer  Z12.5 PSA    Meds ordered this encounter  Medications  . lisinopril (ZESTRIL) 5 MG tablet    Sig: Take 1 tablet (5 mg total) by mouth daily.    Dispense:  91 tablet    Refill:  3   Return precautions advised.  Garret Reddish, MD

## 2019-08-04 ENCOUNTER — Encounter: Payer: Self-pay | Admitting: Gastroenterology

## 2019-08-08 ENCOUNTER — Telehealth: Payer: Self-pay | Admitting: Family Medicine

## 2019-08-08 ENCOUNTER — Other Ambulatory Visit: Payer: Self-pay

## 2019-08-08 DIAGNOSIS — Z1211 Encounter for screening for malignant neoplasm of colon: Secondary | ICD-10-CM

## 2019-08-08 NOTE — Telephone Encounter (Signed)
Colonoscopy referral has been placed and pt called and notified.

## 2019-08-08 NOTE — Telephone Encounter (Signed)
Patient wanted to get schedule for a colonoscopy. Please place referral or order so this can get scheduled by [facility] or another location of preference.

## 2019-08-08 NOTE — Progress Notes (Signed)
Colonoscopy referral placed

## 2019-08-16 ENCOUNTER — Encounter: Payer: Self-pay | Admitting: Family Medicine

## 2019-08-16 ENCOUNTER — Other Ambulatory Visit: Payer: Self-pay

## 2019-08-16 ENCOUNTER — Ambulatory Visit (INDEPENDENT_AMBULATORY_CARE_PROVIDER_SITE_OTHER): Payer: BC Managed Care – PPO

## 2019-08-16 DIAGNOSIS — Z23 Encounter for immunization: Secondary | ICD-10-CM

## 2019-08-16 NOTE — Addendum Note (Signed)
Addended by: Kevan Ny on: 08/16/2019 02:55 PM   Modules accepted: Orders

## 2019-08-16 NOTE — Progress Notes (Addendum)
Per orders of Dr. Jerline Pain, injection of Tdap given left deltoid IM by Clearnce Sorrel Zellmer, CMA  Patient tolerated injection well.

## 2019-10-12 ENCOUNTER — Encounter: Payer: Self-pay | Admitting: Gastroenterology

## 2019-11-14 ENCOUNTER — Other Ambulatory Visit: Payer: Self-pay

## 2019-11-14 ENCOUNTER — Ambulatory Visit (AMBULATORY_SURGERY_CENTER): Payer: Self-pay

## 2019-11-14 VITALS — Temp 97.6°F | Ht 68.0 in | Wt 153.6 lb

## 2019-11-14 DIAGNOSIS — Z86010 Personal history of colonic polyps: Secondary | ICD-10-CM

## 2019-11-14 DIAGNOSIS — Z01818 Encounter for other preprocedural examination: Secondary | ICD-10-CM

## 2019-11-14 MED ORDER — PLENVU 140 G PO SOLR: 1.0000 | Freq: Once | ORAL | 0 refills | Status: AC

## 2019-11-14 NOTE — Progress Notes (Signed)
No egg or soy allergy known to patient  No issues with past sedation with any surgeries  or procedures, no intubation problems  No diet pills per patient No home 02 use per patient  No blood thinners per patient  Pt denies issues with constipation  No A fib or A flutter  EMMI video sent to pt's e mail  plenvu coupon given  Due to the COVID-19 pandemic we are asking patients to follow these guidelines. Please only bring one care partner. Please be aware that your care partner may wait in the car in the parking lot or if they feel like they will be too hot to wait in the car, they may wait in the lobby on the 4th floor. All care partners are required to wear a mask the entire time (we do not have any that we can provide them), they need to practice social distancing, and we will do a Covid check for all patient's and care partners when you arrive. Also we will check their temperature and your temperature. If the care partner waits in their car they need to stay in the parking lot the entire time and we will call them on their cell phone when the patient is ready for discharge so they can bring the car to the front of the building. Also all patient's will need to wear a mask into building.

## 2019-11-15 ENCOUNTER — Encounter: Payer: Self-pay | Admitting: Gastroenterology

## 2019-11-22 ENCOUNTER — Ambulatory Visit (INDEPENDENT_AMBULATORY_CARE_PROVIDER_SITE_OTHER): Payer: BC Managed Care – PPO

## 2019-11-22 ENCOUNTER — Other Ambulatory Visit: Payer: Self-pay | Admitting: Gastroenterology

## 2019-11-22 DIAGNOSIS — Z1159 Encounter for screening for other viral diseases: Secondary | ICD-10-CM

## 2019-11-23 LAB — SARS CORONAVIRUS 2 (TAT 6-24 HRS): SARS Coronavirus 2: NEGATIVE

## 2019-11-25 ENCOUNTER — Encounter: Payer: Self-pay | Admitting: Gastroenterology

## 2019-11-25 ENCOUNTER — Ambulatory Visit (AMBULATORY_SURGERY_CENTER): Payer: BC Managed Care – PPO | Admitting: Gastroenterology

## 2019-11-25 ENCOUNTER — Other Ambulatory Visit: Payer: Self-pay

## 2019-11-25 VITALS — BP 111/67 | HR 62 | Temp 97.6°F | Resp 14 | Ht 68.0 in | Wt 153.0 lb

## 2019-11-25 DIAGNOSIS — D120 Benign neoplasm of cecum: Secondary | ICD-10-CM

## 2019-11-25 DIAGNOSIS — Z86010 Personal history of colonic polyps: Secondary | ICD-10-CM | POA: Diagnosis present

## 2019-11-25 DIAGNOSIS — K635 Polyp of colon: Secondary | ICD-10-CM | POA: Diagnosis not present

## 2019-11-25 MED ORDER — SODIUM CHLORIDE 0.9 % IV SOLN: 500.0000 mL | Freq: Once | INTRAVENOUS | Status: DC

## 2019-11-25 NOTE — Progress Notes (Signed)
A/ox3, pleased with MAC, report to RN 

## 2019-11-25 NOTE — Progress Notes (Signed)
Called to room to assist during endoscopic procedure.  Patient ID and intended procedure confirmed with present staff. Received instructions for my participation in the procedure from the performing physician.  

## 2019-11-25 NOTE — Op Note (Signed)
[facility] Patient Name: Jadenn Brookbank Procedure Date: 11/25/2019 11:20 AM MRN: [id number] Endoscopist: Remo Lipps P. Havery Moros , MD Age: 64 Referring MD:  Date of Birth: [birth year] Gender: Male Account #: [id number] Procedure:                Colonoscopy Indications:              High risk colon cancer surveillance: Personal                            history of colonic polyps (multiple polyps included                            advanced adenoma / SSP removed 3 years ago) Medicines:                Monitored Anesthesia Care Procedure:                Pre-Anesthesia Assessment:                           - Prior to the procedure, a History and Physical                            was performed, and patient medications and                            allergies were reviewed. The patient's tolerance of                            previous anesthesia was also reviewed. The risks                            and benefits of the procedure and the sedation                            options and risks were discussed with the patient.                            All questions were answered, and informed consent                            was obtained. Prior Anticoagulants: The patient has                            taken no previous anticoagulant or antiplatelet                            agents. ASA Grade Assessment: II - A patient with                            mild systemic disease. After reviewing the risks                            and benefits, the patient was deemed in  satisfactory condition to undergo the procedure.                           After obtaining informed consent, the colonoscope                            was passed under direct vision. Throughout the                            procedure, the patient's blood pressure, pulse, and                            oxygen saturations were monitored continuously. The                            Colonoscope  was introduced through the anus and                            advanced to the the cecum, identified by                            appendiceal orifice and ileocecal valve. The                            colonoscopy was performed without difficulty. The                            patient tolerated the procedure well. The quality                            of the bowel preparation was good. The ileocecal                            valve, appendiceal orifice, and rectum were                            photographed. Scope In: 11:29:58 AM Scope Out: 11:49:07 AM Scope Withdrawal Time: 0 hours 15 minutes 5 seconds  Total Procedure Duration: 0 hours 19 minutes 9 seconds  Findings:                 The perianal and digital rectal examinations were                            normal.                           A 5 mm polyp was found in the cecum. The polyp was                            flat. The polyp was removed with a cold snare.                            Resection and retrieval were complete.  Internal hemorrhoids were found during retroflexion.                           The colon was tortuous.                           The exam was otherwise without abnormality. Prior                            polypectomy scar noted in the ascending colon                            without any residual polyp tissue. Complications:            No immediate complications. Estimated blood loss:                            Minimal. Estimated Blood Loss:     Estimated blood loss was minimal. Impression:               - One 5 mm polyp in the cecum, removed with a cold                            snare. Resected and retrieved.                           - Internal hemorrhoids.                           - Tortuous colon.                           - The examination was otherwise normal. Recommendation:           - Patient has a contact number available for                            emergencies.  The signs and symptoms of potential                            delayed complications were discussed with the                            patient. Return to normal activities tomorrow.                            Written discharge instructions were provided to the                            patient.                           - Resume previous diet.                           - Continue present medications.                           -  Await pathology results. Remo Lipps P. Havery Moros, MD 11/25/2019 11:58:44 AM This report has been signed electronically.

## 2019-11-25 NOTE — Progress Notes (Signed)
Pt's states no medical or surgical changes since previsit or office visit.  Vitals- Louis Vaughn- June

## 2019-11-25 NOTE — Patient Instructions (Signed)
YOU HAD AN ENDOSCOPIC PROCEDURE TODAY AT [facility]:   Refer to the procedure report that was given to you for any specific questions about what was found during the examination.  If the procedure report does not answer your questions, please call your gastroenterologist to clarify.  If you requested that your care partner not be given the details of your procedure findings, then the procedure report has been included in a sealed envelope for you to review at your convenience later.  YOU SHOULD EXPECT: Some feelings of bloating in the abdomen. Passage of more gas than usual.  Walking can help get rid of the air that was put into your GI tract during the procedure and reduce the bloating. If you had a lower endoscopy (such as a colonoscopy or flexible sigmoidoscopy) you may notice spotting of blood in your stool or on the toilet paper. If you underwent a bowel prep for your procedure, you may not have a normal bowel movement for a few days.  Please Note:  You might notice some irritation and congestion in your nose or some drainage.  This is from the oxygen used during your procedure.  There is no need for concern and it should clear up in a day or so.  SYMPTOMS TO REPORT IMMEDIATELY:   Following lower endoscopy (colonoscopy or flexible sigmoidoscopy):  Excessive amounts of blood in the stool  Significant tenderness or worsening of abdominal pains  Swelling of the abdomen that is new, acute  Fever of 100F or higher    For urgent or emergent issues, a gastroenterologist can be reached at any hour by calling [phone].   DIET:  We do recommend a small meal at first, but then you may proceed to your regular diet.  Drink plenty of fluids but you should avoid alcoholic beverages for 24 hours.  ACTIVITY:  You should plan to take it easy for the rest of today and you should NOT DRIVE or use heavy machinery until tomorrow (because of the sedation medicines used during the test).     FOLLOW UP: Our staff will call the number listed on your records 48-72 hours following your procedure to check on you and address any questions or concerns that you may have regarding the information given to you following your procedure. If we do not reach you, we will leave a message.  We will attempt to reach you two times.  During this call, we will ask if you have developed any symptoms of COVID 19. If you develop any symptoms (ie: fever, flu-like symptoms, shortness of breath, cough etc.) before then, please call [phone].  If you test positive for Covid 19 in the 2 weeks post procedure, please call and report this information to [place].    If any biopsies were taken you will be contacted by phone or by letter within the next 1-3 weeks.  Please call us at [phone] if you have not heard about the biopsies in 3 weeks.    SIGNATURES/CONFIDENTIALITY: You and/or your care partner have signed paperwork which will be entered into your electronic medical record.  These signatures attest to the fact that that the information above on your After Visit Summary has been reviewed and is understood.  Full responsibility of the confidentiality of this discharge information lies with you and/or your care-partner.   Resume medications. Information given on polyps and hemorrhoids.

## 2019-11-29 ENCOUNTER — Telehealth: Payer: Self-pay

## 2019-11-29 NOTE — Telephone Encounter (Signed)
LVM

## 2019-11-29 NOTE — Telephone Encounter (Signed)
Pt called returning your call Patient states he is feeling well.

## 2019-11-29 NOTE — Telephone Encounter (Signed)
  Follow up Call-  Call back number 11/25/2019  Post procedure Call Back phone  # [phone]  Permission to leave phone message Yes  Some recent data might be hidden     Patient questions:  Do you have a fever, pain , or abdominal swelling? No. Pain Score  0 *  Have you tolerated food without any problems? Yes.    Have you been able to return to your normal activities? Yes.    Do you have any questions about your discharge instructions: Diet   No. Medications  No. Follow up visit  No.  Do you have questions or concerns about your Care? No.  Actions: * If pain score is 4 or above: No action needed, pain <4. 1. Have you developed a fever since your procedure? no  2.   Have you had an respiratory symptoms (SOB or cough) since your procedure? no  3.   Have you tested positive for COVID 19 since your procedure no  4.   Have you had any family members/close contacts diagnosed with the COVID 19 since your procedure?  no   If yes to any of these questions please route to Joylene John, RN and Alphonsa Gin, Therapist, sports.

## 2019-11-30 ENCOUNTER — Encounter: Payer: Self-pay | Admitting: Family Medicine

## 2019-11-30 DIAGNOSIS — K635 Polyp of colon: Secondary | ICD-10-CM | POA: Insufficient documentation

## 2020-01-23 ENCOUNTER — Encounter: Payer: Self-pay | Admitting: Family Medicine

## 2020-03-05 ENCOUNTER — Ambulatory Visit (INDEPENDENT_AMBULATORY_CARE_PROVIDER_SITE_OTHER): Payer: BC Managed Care – PPO | Admitting: Family Medicine

## 2020-03-05 ENCOUNTER — Other Ambulatory Visit: Payer: Self-pay

## 2020-03-05 ENCOUNTER — Encounter: Payer: Self-pay | Admitting: Family Medicine

## 2020-03-05 VITALS — BP 130/80 | HR 55 | Temp 98.2°F | Ht 68.0 in | Wt 153.6 lb

## 2020-03-05 DIAGNOSIS — Z0184 Encounter for antibody response examination: Secondary | ICD-10-CM | POA: Diagnosis not present

## 2020-03-05 DIAGNOSIS — R972 Elevated prostate specific antigen [PSA]: Secondary | ICD-10-CM

## 2020-03-05 DIAGNOSIS — I10 Essential (primary) hypertension: Secondary | ICD-10-CM | POA: Diagnosis not present

## 2020-03-05 DIAGNOSIS — Z20822 Contact with and (suspected) exposure to covid-19: Secondary | ICD-10-CM

## 2020-03-05 NOTE — Patient Instructions (Addendum)
Please stop by lab before you go If you have mychart- we will send your results within 3 business days of [place] receiving them.  If you do not have mychart- we will call you about results within 5 business days of [place] receiving them.   Also schedule a physical sometime after July 18.

## 2020-03-05 NOTE — Progress Notes (Signed)
  Phone [phone] In person visit   Subjective:   Louis Vaughn is a 64 y.o. year old very pleasant male patient who presents for/with See problem oriented charting Chief Complaint  Patient presents with  . Follow-up  . covid antibody    This visit occurred during the SARS-CoV-2 public health emergency.  Safety protocols were in place, including screening questions prior to the visit, additional usage of staff PPE, and extensive cleaning of exam room while observing appropriate contact time as indicated for disinfecting solutions.   Past Medical History-  Patient Active Problem List   Diagnosis Date Noted  . Elevated PSA, less than 10 ng/ml 08/11/2016    Priority: Medium  . Hyperlipidemia 11/14/2015    Priority: Medium  . Essential hypertension, benign 09/11/2014    Priority: Medium  . Sessile colonic polyp 11/30/2019    Priority: Low  . Nasal polyps 11/22/2015    Priority: Low  . Osteoarthritis of left knee 01/16/2015    Priority: Low    Medications- reviewed and updated Current Outpatient Medications  Medication Sig Dispense Refill  . Glucosamine 750 MG TABS Take 3 tablets by mouth daily.    Marland Kitchen ketoconazole (NIZORAL) 2 % shampoo ketoconazole 2 % shampoo    . lisinopril (ZESTRIL) 5 MG tablet Take 1 tablet (5 mg total) by mouth daily. 91 tablet 3  . Multiple Vitamin (MULTIVITAMIN) tablet Take 1 tablet by mouth daily.    Marland Kitchen zinc gluconate 50 MG tablet Take 50 mg by mouth daily.     No current facility-administered medications for this visit.     Objective:  BP 130/80   Pulse (!) 55   Temp 98.2 F (36.8 C)   Ht 5\' 8"  (1.727 m)   Wt 153 lb 9.6 oz (69.7 kg)   SpO2 98%   BMI 23.35 kg/m  Gen: NAD, resting comfortably CV: RRR no murmurs rubs or gallops Lungs: CTAB no crackles, wheeze, rhonchi Ext: no edema Skin: warm, dry    Assessment and Plan   # Covid Anitbody S:pt would like to have lab work for covid antibody. Traveling to see mom in [place] in 10  days Immunization History  Administered Date(s) Administered  . Influenza,inj,Quad PF,6+ Mos 08/16/2019  . PFIZER SARS-COV-2 Vaccination 12/30/2019, 01/20/2020  . Td 06/14/2009  . Tdap 08/16/2019  A/P: patient is traveling-he has had both COVID-19 vaccinations.  Just like everyone else have likely had potential exposures to COVID-19 in the past. He would like to make sure that he still has antibodies after vaccination before travel.   #hypertension S: medication: Lisinopril 5 mg Home readings #s: 120 or less at [facility] when he checks BP Readings from Last 3 Encounters:  03/05/20 130/80  11/25/19 111/67  05/20/19 126/84  A/P: Stable. Continue current medications.    Recommended follow up: I recommended patient schedule a physical sometime after July 18  Lab/Order associations:   ICD-10-CM   1. Essential hypertension, benign  I10   2. Exposure to COVID-19 virus  Z20.822 SARS-COV-2 IgG  3. Elevated PSA, less than 10 ng/ml  R97.20    Return precautions advised.  Garret Reddish, MD

## 2020-03-06 LAB — SARS-COV-2 IGG: SARS-COV-2 IgG: 24.65

## 2020-05-16 ENCOUNTER — Other Ambulatory Visit: Payer: Self-pay | Admitting: Family Medicine

## 2020-06-25 NOTE — Progress Notes (Signed)
Phone: [phone]   Subjective:  Patient presents today for their annual physical. Chief complaint-noted.   See problem oriented charting- Review of Systems  Constitutional: Negative for chills and fever.  HENT: Negative for hearing loss and tinnitus.   Eyes: Negative for blurred vision and double vision.  Respiratory: Negative for cough, shortness of breath and wheezing.   Cardiovascular: Negative for chest pain, palpitations and leg swelling.  Gastrointestinal: Negative for heartburn, nausea and vomiting.  Genitourinary: Negative for dysuria, frequency and urgency.  Musculoskeletal: Negative for back pain, joint pain and neck pain.  Skin: Negative for rash.  Neurological: Negative for dizziness, seizures, weakness and headaches.  Endo/Heme/Allergies: Does not bruise/bleed easily.  Psychiatric/Behavioral: Negative for depression, memory loss and suicidal ideas. The patient does not have insomnia.     The following were reviewed and entered/updated in epic: Past Medical History:  Diagnosis Date  . Hypertension   . Left elbow tendonitis   . Osteoarthritis of left knee    knees   Patient Active Problem List   Diagnosis Date Noted  . Elevated PSA, less than 10 ng/ml 08/11/2016    Priority: Medium  . Hyperlipidemia 11/14/2015    Priority: Medium  . Essential hypertension, benign 09/11/2014    Priority: Medium  . Sessile colonic polyp 11/30/2019    Priority: Low  . Nasal polyps 11/22/2015    Priority: Low  . Osteoarthritis of left knee 01/16/2015    Priority: Low   Past Surgical History:  Procedure Laterality Date  . COLONOSCOPY    . none    . POLYPECTOMY    . WISDOM TOOTH EXTRACTION      Family History  Problem Relation Age of Onset  . Hypertension Mother   . CVA Father        73  . Sleep apnea Brother   . Colon cancer Neg Hx   . Esophageal cancer Neg Hx   . Rectal cancer Neg Hx   . Stomach cancer Neg Hx   . Colon polyps Neg Hx     Medications-  reviewed and updated Current Outpatient Medications  Medication Sig Dispense Refill  . Glucosamine 750 MG TABS Take 3 tablets by mouth daily.    Marland Kitchen ketoconazole (NIZORAL) 2 % shampoo ketoconazole 2 % shampoo    . lisinopril (ZESTRIL) 5 MG tablet TAKE 1 TABLET(5 MG) BY MOUTH DAILY 90 tablet 0  . Multiple Vitamin (MULTIVITAMIN) tablet Take 1 tablet by mouth daily.    Marland Kitchen zinc gluconate 50 MG tablet Take 50 mg by mouth daily.     No current facility-administered medications for this visit.    Allergies-reviewed and updated No Known Allergies  Social History   Social History Narrative   Married (not sure if wife a patient here). 1 daughter 27 lives in [place] in 2016.       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 Oropharynx normal Neck: no thyromegaly CV: RRR no murmurs rubs or gallops Lungs: CTAB no crackles, wheeze, rhonchi Abdomen: soft/nontender/nondistended/normal bowel sounds. No rebound or guarding.  Ext: no edema Skin: warm, dry Neuro: grossly normal, moves all extremities, PERRLA  Assessment and Plan  64 y.o. male presenting for annual physical.  Health Maintenance counseling: 1. Anticipatory guidance: Patient counseled regarding regular dental exams q6 months, eye exams yearly,  avoiding smoking and second hand smoke , limiting alcohol to 2 beverages per day - red wine 2 times a week a glass.   2. Risk factor reduction:  Advised patient of need for regular exercise and diet rich and fruits and vegetables to reduce risk of heart attack and stroke. Exercise- 3 times a week 45 minutes. Diet-has healthy diet limits red meat.   Wt Readings from Last 3 Encounters:    06/26/20 152 lb (68.9 kg)  03/05/20 153 lb 9.6 oz (69.7 kg)  11/25/19 153 lb (69.4 kg)  3. Immunizations/screenings/ancillary studies-thanked patient for doing Covid vaccination and told him would be eligible November 19 for booster.  Recommended flu shot in the fall-we do not have available quite yet.  Declines Shingrix as he is rather firm that he has never had chickenpox-I previously discussed with him that he is still a candidate Immunization History  Administered Date(s) Administered  . Influenza,inj,Quad PF,6+ Mos 08/16/2019  . PFIZER SARS-COV-2 Vaccination 12/30/2019, 01/20/2020  . Td 06/14/2009  . Tdap 08/16/2019  4. Prostate cancer screening- low risk prior PSA trend-we will trend PSA with labs.  Previously followed by urology for elevated PSA but has trended down- he thinks he saw him in the spring Lab Results  Component Value Date   PSA 1.69 05/20/2019   PSA 1.95 12/16/2017   PSA 2.93 08/11/2016   5. Colon cancer screening -  Up to date next due 11/24/2024.  Last completed 09/24/2020-Sessile serrated 6. Skin cancer screening- advised regular sunscreen use. Denies worrisome, changing, or new skin lesions. Followed by dermatology.  7. never smoker 8. STD screening -  Declined as monogamous  Status of chronic or acute concerns   #hypertension S: medication: Lisinopril 5Mg  Home readings #s: Normal readings at home.  BP Readings from Last 3 Encounters:  06/26/20 120/68  03/05/20 130/80  11/25/19 111/67  A/P: Excellent blood pressure control-continue current medication  #hyperlipidemia S: Medication: None.  10-year ASCVD risk based off last year's labs at 11.7%. had jumped up from 2 years ago.  Lab Results  Component Value Date   CHOL 214 (H) 05/20/2019   HDL 59.50 05/20/2019   LDLCALC 145 (H) 05/20/2019   TRIG 47.0 05/20/2019   CHOLHDL 4 05/20/2019   A/P: Update lipid panel-recalculate 10-year ASCVD risk.  - could consider coronary calcium scoring- told him would be  out of pocket $150  Recommended follow up: Return in about 1 year (around 06/26/2021) for physical or sooner if needed.  as long as does home blood pressure monitoring  Lab/Order associations: fasting   ICD-10-CM   1. Preventative health care  Z00.00 CBC with Differential/Platelet    Lipid panel    PSA    COMPLETE METABOLIC PANEL WITH GFR  2. Essential hypertension, benign  O29 COMPLETE METABOLIC PANEL WITH GFR  3. Hyperlipidemia, unspecified hyperlipidemia type  E78.5 CBC with Differential/Platelet    Lipid panel  4. Elevated PSA, less than 10 ng/ml  R97.20   5. Screen for colon cancer  Z12.11 PSA    Meds ordered this encounter  Medications  . lisinopril (ZESTRIL) 5 MG tablet    Sig: TAKE 1 TABLET(5 MG) BY MOUTH DAILY    Dispense:  90 tablet    Refill:  3    Needs Appt    Return precautions advised.  Garret Reddish, MD

## 2020-06-25 NOTE — Patient Instructions (Addendum)
Please stop by lab before you go If you have mychart- we will send your results within 3 business days of [place] receiving them.  If you do not have mychart- we will call you about results within 5 business days of [place] receiving them.  *please note we are currently using [facility] which has a longer processing time than [facility] typically so labs may not come back as quickly as in the past *please also note that you will see labs on mychart as soon as they post. I will later go in and write notes on them- will say "notes from Dr. Yong Channel"  Lets schedule 1 year physical- see me sooner if blood pressure >138/88 on average. You agreed to monitor at least a few times a month if we stretch visits out to once a year. Also happy to see you in 6 months before you leave if you would like.   Health Maintenance Due  Topic Date Due  . INFLUENZA VACCINE - - will complete later in flu season (please let us know if you get this at another location so we can update your chart) . We should have vaccination here in 1-2 months - can call back for an appointment.   06/03/2020

## 2020-06-26 ENCOUNTER — Ambulatory Visit: Payer: BC Managed Care – PPO | Admitting: Family Medicine

## 2020-06-26 ENCOUNTER — Other Ambulatory Visit: Payer: Self-pay

## 2020-06-26 ENCOUNTER — Encounter: Payer: Self-pay | Admitting: Family Medicine

## 2020-06-26 VITALS — BP 120/68 | HR 63 | Temp 98.1°F | Ht 68.0 in | Wt 152.0 lb

## 2020-06-26 DIAGNOSIS — Z1211 Encounter for screening for malignant neoplasm of colon: Secondary | ICD-10-CM

## 2020-06-26 DIAGNOSIS — I10 Essential (primary) hypertension: Secondary | ICD-10-CM | POA: Diagnosis not present

## 2020-06-26 DIAGNOSIS — Z0000 Encounter for general adult medical examination without abnormal findings: Secondary | ICD-10-CM | POA: Diagnosis not present

## 2020-06-26 DIAGNOSIS — R972 Elevated prostate specific antigen [PSA]: Secondary | ICD-10-CM

## 2020-06-26 DIAGNOSIS — E785 Hyperlipidemia, unspecified: Secondary | ICD-10-CM

## 2020-06-26 MED ORDER — LISINOPRIL 5 MG PO TABS
ORAL_TABLET | ORAL | 3 refills | Status: DC
Start: 2020-06-26 — End: 2020-12-20

## 2020-06-26 NOTE — Addendum Note (Signed)
Addended by: Liliane Channel on: 06/26/2020 10:40 AM   Modules accepted: Orders

## 2020-06-27 LAB — CBC WITH DIFFERENTIAL/PLATELET
Absolute Monocytes: 439 cells/uL (ref 200–950)
Basophils Absolute: 23 cells/uL (ref 0–200)
Basophils Relative: 0.4 %
Eosinophils Absolute: 40 cells/uL (ref 15–500)
Eosinophils Relative: 0.7 %
HCT: 44.3 % (ref 38.5–50.0)
Hemoglobin: 15 g/dL (ref 13.2–17.1)
Lymphs Abs: 1659 cells/uL (ref 850–3900)
MCH: 31.2 pg (ref 27.0–33.0)
MCHC: 33.9 g/dL (ref 32.0–36.0)
MCV: 92.1 fL (ref 80.0–100.0)
MPV: 10.4 fL (ref 7.5–12.5)
Monocytes Relative: 7.7 %
Neutro Abs: 3540 cells/uL (ref 1500–7800)
Neutrophils Relative %: 62.1 %
Platelets: 254 10*3/uL (ref 140–400)
RBC: 4.81 10*6/uL (ref 4.20–5.80)
RDW: 11.5 % (ref 11.0–15.0)
Total Lymphocyte: 29.1 %
WBC: 5.7 10*3/uL (ref 3.8–10.8)

## 2020-06-27 LAB — LIPID PANEL
Cholesterol: 218 mg/dL — ABNORMAL HIGH (ref <200)
HDL: 62 mg/dL (ref >=40)
LDL Cholesterol (Calc): 134 mg/dL (calc) — ABNORMAL HIGH
Non-HDL Cholesterol (Calc): 156 mg/dL (calc) — ABNORMAL HIGH (ref <130)
Total CHOL/HDL Ratio: 3.5 (calc) (ref <5.0)
Triglycerides: 110 mg/dL (ref <150)

## 2020-06-27 LAB — PSA: PSA: 3.1 ng/mL (ref <=4.0)

## 2020-06-27 LAB — COMPLETE METABOLIC PANEL WITH GFR
AG Ratio: 1.8 (calc) (ref 1.0–2.5)
ALT: 41 U/L (ref 9–46)
AST: 27 U/L (ref 10–35)
Albumin: 4.1 g/dL (ref 3.6–5.1)
Alkaline phosphatase (APISO): 52 U/L (ref 35–144)
BUN: 10 mg/dL (ref 7–25)
CO2: 26 mmol/L (ref 20–32)
Calcium: 9 mg/dL (ref 8.6–10.3)
Chloride: 101 mmol/L (ref 98–110)
Creat: 0.89 mg/dL (ref 0.70–1.25)
GFR, Est African American: 105 mL/min/{1.73_m2} (ref >=60)
GFR, Est Non African American: 90 mL/min/{1.73_m2} (ref >=60)
Globulin: 2.3 g/dL (calc) (ref 1.9–3.7)
Glucose, Bld: 99 mg/dL (ref 65–99)
Potassium: 4.3 mmol/L (ref 3.5–5.3)
Sodium: 137 mmol/L (ref 135–146)
Total Bilirubin: 0.9 mg/dL (ref 0.2–1.2)
Total Protein: 6.4 g/dL (ref 6.1–8.1)

## 2020-08-02 ENCOUNTER — Other Ambulatory Visit: Payer: Self-pay

## 2020-08-02 ENCOUNTER — Encounter: Payer: Self-pay | Admitting: Family Medicine

## 2020-08-02 ENCOUNTER — Ambulatory Visit (INDEPENDENT_AMBULATORY_CARE_PROVIDER_SITE_OTHER): Payer: BC Managed Care – PPO

## 2020-08-02 DIAGNOSIS — Z23 Encounter for immunization: Secondary | ICD-10-CM | POA: Diagnosis not present

## 2020-10-19 ENCOUNTER — Encounter: Payer: Self-pay | Admitting: Family Medicine

## 2020-10-30 ENCOUNTER — Encounter: Payer: Self-pay | Admitting: Family Medicine

## 2020-12-20 ENCOUNTER — Other Ambulatory Visit: Payer: Self-pay

## 2020-12-20 ENCOUNTER — Ambulatory Visit (INDEPENDENT_AMBULATORY_CARE_PROVIDER_SITE_OTHER): Payer: Medicare HMO | Admitting: Family Medicine

## 2020-12-20 ENCOUNTER — Encounter: Payer: Self-pay | Admitting: Family Medicine

## 2020-12-20 VITALS — BP 128/84 | HR 65 | Temp 98.1°F | Ht 68.0 in | Wt 145.2 lb

## 2020-12-20 DIAGNOSIS — E785 Hyperlipidemia, unspecified: Secondary | ICD-10-CM

## 2020-12-20 DIAGNOSIS — I10 Essential (primary) hypertension: Secondary | ICD-10-CM | POA: Diagnosis not present

## 2020-12-20 MED ORDER — LISINOPRIL 5 MG PO TABS
ORAL_TABLET | ORAL | 0 refills | Status: DC
Start: 2020-12-20 — End: 2021-06-26

## 2020-12-20 NOTE — Patient Instructions (Addendum)
Hope you have safe travels and are able to leave safely soon  Schedule a lab visit at the check out desk within 2 weeks. Return for future fasting labs meaning nothing but water after midnight please. Ok to take your medications with water.   We will call you within two weeks about your referral for coronary calcium scoring. If you do not hear within 2 weeks, give [place] a call.   Recommended follow up: Return in about 1 year (around 12/20/2021) for physical or sooner if needed.

## 2020-12-20 NOTE — Progress Notes (Signed)
Phone [phone] In person visit   Subjective:   Louis Vaughn is a 64 y.o. year old very pleasant male patient who presents for/with See problem oriented charting Chief Complaint  Patient presents with  . Medication Discussion     Patient will be out of town for over a year    This visit occurred during the SARS-CoV-2 public health emergency.  Safety protocols were in place, including screening questions prior to the visit, additional usage of staff PPE, and extensive cleaning of exam room while observing appropriate contact time as indicated for disinfecting solutions.   Past Medical History-  Patient Active Problem List   Diagnosis Date Noted  . Elevated PSA, less than 10 ng/ml 08/11/2016    Priority: Medium  . Hyperlipidemia 11/14/2015    Priority: Medium  . Essential hypertension, benign 09/11/2014    Priority: Medium  . Sessile colonic polyp 11/30/2019    Priority: Low  . Nasal polyps 11/22/2015    Priority: Low  . Osteoarthritis of left knee 01/16/2015    Priority: Low   Medications- reviewed and updated Current Outpatient Medications  Medication Sig Dispense Refill  . Glucosamine 750 MG TABS Take 2 tablets by mouth daily.    Marland Kitchen ketoconazole (NIZORAL) 2 % shampoo ketoconazole 2 % shampoo    . Multiple Vitamin (MULTIVITAMIN) tablet Take 1 tablet by mouth daily.    . Omega-3 Fatty Acids (FISH OIL PO) Take by mouth.    Marland Kitchen lisinopril (ZESTRIL) 5 MG tablet TAKE 1 TABLET(5 MG) BY MOUTH DAILY 365 tablet 0   No current facility-administered medications for this visit.     Objective:  BP 128/84   Pulse 65   Temp 98.1 F (36.7 C) (Temporal)   Ht 5\' 8"  (1.727 m)   Wt 145 lb 3.2 oz (65.9 kg)   SpO2 98%   BMI 22.08 kg/m  Gen: NAD, resting comfortably CV: RRR no murmurs rubs or gallops Lungs: CTAB no crackles, wheeze, rhonchi Ext: no edema Skin: warm, dry    Assessment and Plan   #upcoming trip to [place] to be with mother who is over 10 years old (92 this  year)- will be there for a year- some difficulty due to covid/virus  #hypertension S: medication: lisinopril 5 mg  Patient is going out of the country for potentially a year of months to make sure not to run out of medicine Home readings #s: 120s/high 70s typically BP Readings from Last 3 Encounters:  12/20/20 128/84  06/26/20 120/68  03/05/20 130/80  A/P: Stable. Continue current medications. 1 year supply writtn.   #hyperlipidemia- on new insurance so can order under a year S: Medication:none   Weight down a few lbs- exercising regularly, eating lower portion size  Lab Results  Component Value Date   CHOL 218 (H) 06/26/2020   HDL 62 06/26/2020   LDLCALC 134 (H) 06/26/2020   TRIG 110 06/26/2020   CHOLHDL 3.5 06/26/2020   A/P: he has made some adjustments thand lost a few lbs- wants to see what lipids have done with these changes- will come back for fasting labs. I really dont want him to lose more weight- good healthy weight.  -he is also interested in coronary calcium scoring - this was ordered today  Recommended follow up: needs follow up when gets back in country in a year.   Lab/Order associations:   ICD-10-CM   1. Essential hypertension, benign  I10   2. Hyperlipidemia, unspecified hyperlipidemia type  E78.5 Lipid panel  CT CARDIAC SCORING (SELF PAY ONLY)    Meds ordered this encounter  Medications  . lisinopril (ZESTRIL) 5 MG tablet    Sig: TAKE 1 TABLET(5 MG) BY MOUTH DAILY    Dispense:  365 tablet    Refill:  0    Patient traveling internationally for a year- needs full year supply please   Return precautions advised.  Garret Reddish, MD

## 2020-12-21 ENCOUNTER — Other Ambulatory Visit (INDEPENDENT_AMBULATORY_CARE_PROVIDER_SITE_OTHER): Payer: Medicare HMO

## 2020-12-21 ENCOUNTER — Other Ambulatory Visit: Payer: Self-pay

## 2020-12-21 DIAGNOSIS — E785 Hyperlipidemia, unspecified: Secondary | ICD-10-CM | POA: Diagnosis not present

## 2020-12-21 LAB — LIPID PANEL
Cholesterol: 194 mg/dL (ref 0–200)
HDL: 67.2 mg/dL (ref >39.00)
LDL Cholesterol: 111 mg/dL — ABNORMAL HIGH (ref 0–99)
NonHDL: 126.62
Total CHOL/HDL Ratio: 3
Triglycerides: 78 mg/dL (ref 0.0–149.0)
VLDL: 15.6 mg/dL (ref 0.0–40.0)

## 2021-01-08 ENCOUNTER — Other Ambulatory Visit: Payer: Self-pay

## 2021-01-08 ENCOUNTER — Encounter: Payer: Self-pay | Admitting: Family Medicine

## 2021-01-08 ENCOUNTER — Ambulatory Visit (INDEPENDENT_AMBULATORY_CARE_PROVIDER_SITE_OTHER)
Admission: RE | Admit: 2021-01-08 | Discharge: 2021-01-08 | Disposition: A | Discharge status: Home / Self Care | Payer: Self-pay | Source: Ambulatory Visit | Attending: Family Medicine | Admitting: Family Medicine

## 2021-01-08 DIAGNOSIS — E785 Hyperlipidemia, unspecified: Secondary | ICD-10-CM

## 2021-01-08 DIAGNOSIS — I700 Atherosclerosis of aorta: Secondary | ICD-10-CM | POA: Insufficient documentation

## 2021-03-20 ENCOUNTER — Encounter: Payer: Self-pay | Admitting: *Deleted

## 2021-03-20 ENCOUNTER — Other Ambulatory Visit: Payer: Self-pay

## 2021-03-20 ENCOUNTER — Ambulatory Visit (INDEPENDENT_AMBULATORY_CARE_PROVIDER_SITE_OTHER): Payer: Medicare HMO | Admitting: *Deleted

## 2021-03-20 DIAGNOSIS — Z23 Encounter for immunization: Secondary | ICD-10-CM

## 2021-03-20 NOTE — Progress Notes (Signed)
Per orders of Dr. Yong Channel, injection of Prevnar 13, 0.5 ml IM given by Anselmo Pickler, LPN in left deltoid. Patient tolerated injection well.

## 2021-04-03 DIAGNOSIS — M7061 Trochanteric bursitis, right hip: Secondary | ICD-10-CM | POA: Diagnosis not present

## 2021-05-20 ENCOUNTER — Encounter: Payer: Self-pay | Admitting: Dermatology

## 2021-05-20 ENCOUNTER — Other Ambulatory Visit: Payer: Self-pay

## 2021-05-20 ENCOUNTER — Ambulatory Visit (INDEPENDENT_AMBULATORY_CARE_PROVIDER_SITE_OTHER): Payer: Medicare HMO | Admitting: Dermatology

## 2021-05-20 DIAGNOSIS — L821 Other seborrheic keratosis: Secondary | ICD-10-CM | POA: Diagnosis not present

## 2021-05-20 DIAGNOSIS — L219 Seborrheic dermatitis, unspecified: Secondary | ICD-10-CM

## 2021-05-20 DIAGNOSIS — Z1283 Encounter for screening for malignant neoplasm of skin: Secondary | ICD-10-CM | POA: Diagnosis not present

## 2021-05-20 DIAGNOSIS — L918 Other hypertrophic disorders of the skin: Secondary | ICD-10-CM | POA: Diagnosis not present

## 2021-05-20 DIAGNOSIS — L729 Follicular cyst of the skin and subcutaneous tissue, unspecified: Secondary | ICD-10-CM | POA: Diagnosis not present

## 2021-05-20 MED ORDER — KETOCONAZOLE 2 % EX SHAM: MEDICATED_SHAMPOO | CUTANEOUS | 10 refills | Status: DC

## 2021-05-20 NOTE — Progress Notes (Signed)
   Follow-Up Visit   Subjective  Louis Vaughn is a 65 y.o. male who presents for the following: Annual Exam (Patient here today for yearly skin check. No concerns).  Annual skin examination and continuing scale on scalp. Location:  Duration:  Quality:  Associated Signs/Symptoms: Modifying Factors:  Severity:  Timing: Context:   Objective  Well appearing patient in no apparent distress; mood and affect are within normal limits.   A full examination was performed including scalp, head, eyes, ears, nose, lips, neck, chest, axillae, abdomen, back, buttocks, bilateral upper extremities, bilateral lower extremities, hands, feet, fingers, toes, fingernails, and toenails. All findings within normal limits unless otherwise noted below.   Assessment & Plan   Multiple seborrheic keratoses including slightly darker spot on right lower abdomen, 1 on upper sternum, multiple on the back, one on the back of the right knee area.  Several skin tags prickly under arms.  Some residual cyst in the upper back but it is stable and not bothersome to the patient.  No atypical pigmented lesions or skin cancer.     I, Lavonna Monarch, MD, have reviewed all documentation for this visit.  The documentation on 05/20/21 for the exam, diagnosis, procedures, and orders are all accurate and complete.

## 2021-05-22 ENCOUNTER — Telehealth: Payer: Self-pay | Admitting: *Deleted

## 2021-05-22 DIAGNOSIS — L219 Seborrheic dermatitis, unspecified: Secondary | ICD-10-CM

## 2021-05-22 MED ORDER — KETOCONAZOLE 2 % EX SHAM: MEDICATED_SHAMPOO | CUTANEOUS | 10 refills | Status: DC

## 2021-05-22 NOTE — Telephone Encounter (Signed)
Fax from pharmacy needing specific directions to process prescription.

## 2021-05-23 ENCOUNTER — Telehealth: Payer: Self-pay | Admitting: Dermatology

## 2021-05-23 DIAGNOSIS — L219 Seborrheic dermatitis, unspecified: Secondary | ICD-10-CM

## 2021-05-23 MED ORDER — KETOCONAZOLE 2 % EX SHAM: MEDICATED_SHAMPOO | CUTANEOUS | 10 refills | Status: DC

## 2021-05-23 NOTE — Addendum Note (Signed)
Addended by: Lennie Odor on: 05/23/2021 01:26 PM   Modules accepted: Orders

## 2021-05-23 NOTE — Telephone Encounter (Signed)
Colletta Maryland from [employer] [phone], left message on office voice mail that she received a prescription for patient for Ketoconazole with directions to apply to scalp, let sit, and then rinse.  Per Colletta Maryland she needs to know how often this is to be done (frequency).

## 2021-05-23 NOTE — Telephone Encounter (Addendum)
Prescription corrected and escribed back to patient's Pharmacy.

## 2021-05-28 ENCOUNTER — Encounter: Payer: Self-pay | Admitting: Dermatology

## 2021-05-29 DIAGNOSIS — H5213 Myopia, bilateral: Secondary | ICD-10-CM | POA: Diagnosis not present

## 2021-05-29 DIAGNOSIS — I10 Essential (primary) hypertension: Secondary | ICD-10-CM | POA: Diagnosis not present

## 2021-05-29 DIAGNOSIS — H2513 Age-related nuclear cataract, bilateral: Secondary | ICD-10-CM | POA: Diagnosis not present

## 2021-06-01 IMAGING — CT CT CARDIAC CORONARY ARTERY CALCIUM SCORE
3 series · 14 of 20 positions shown, 15 images · non-contrast
Comparison: None.
COMPARISON: None.

Addendum:
EXAM:
OVER-READ INTERPRETATION  CT CHEST

The following report is an over-read performed by radiologist Dr.
Meghana Fielding [REDACTED] on 01/08/2021. This
over-read does not include interpretation of cardiac or coronary
anatomy or pathology. The coronary calcium score interpretation by
the cardiologist is attached.
CLINICAL DATA: Risk stratification
Coronary Calcium Score
TECHNIQUE: The patient was scanned on a Siemens Force scanner. Axial
non-contrast 3 mm slices were carried out through the heart. The
data set was analyzed on a dedicated work station and scored using
the Agatson method.

[Series 2: casc 3.0 bv41 2 bestdiast 69 % · axial · 0.43mm/px · z∈[-265,-181]mm · 4 of 48 slices shown, 5 images]
[im 10/48  vessel]
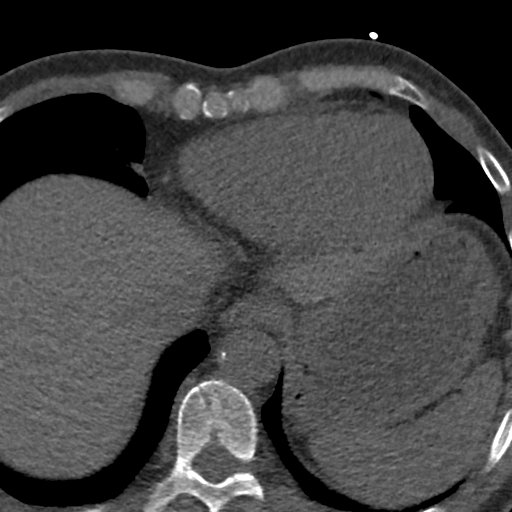
[im 10/48  lung]
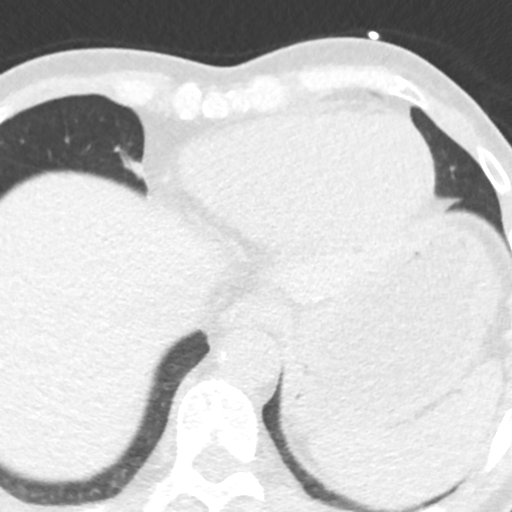
[im 19/48  vessel]
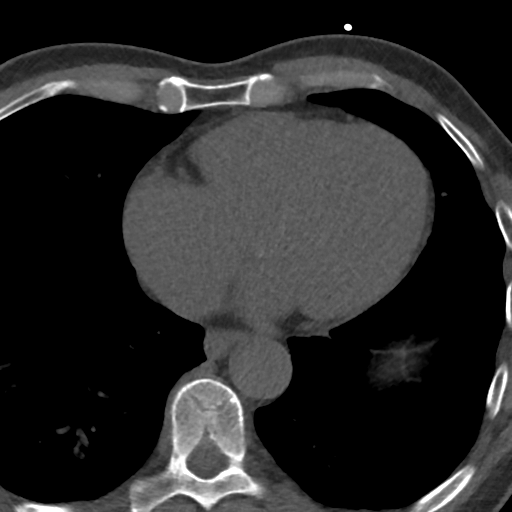
[im 29/48  vessel]
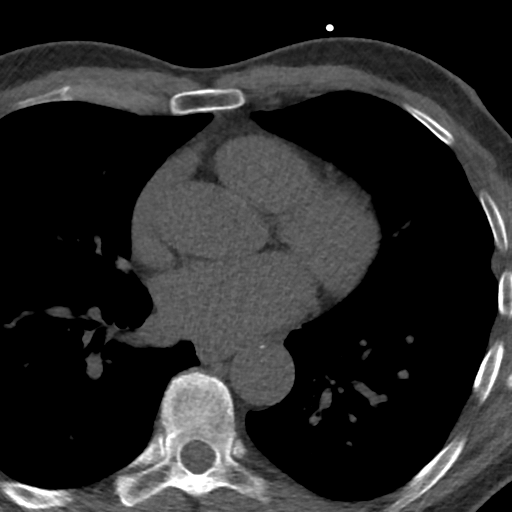
[im 38/48  vessel]
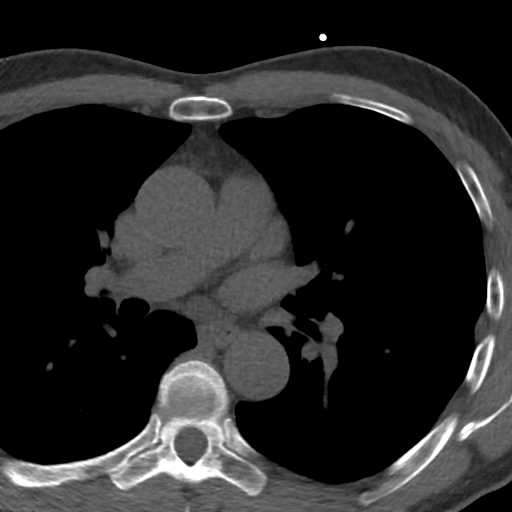

[Series 3: lung 69 % · axial · 0.68mm/px · z∈[-270,-174]mm · 5 of 49 slices shown]
[im 9/49  lung]
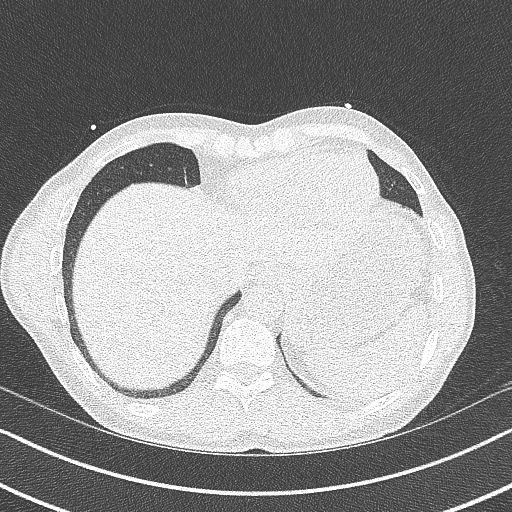
[im 17/49  lung]
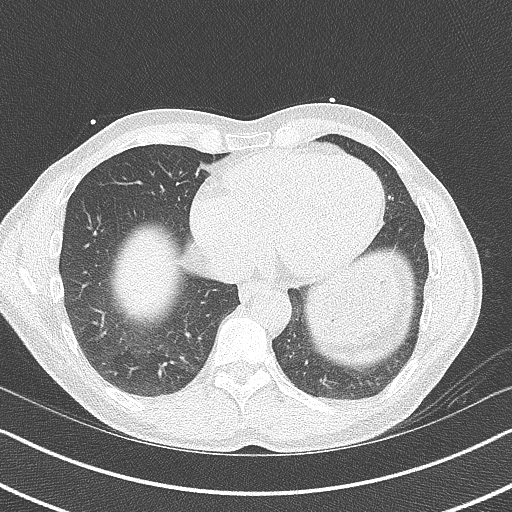
[im 25/49  lung]
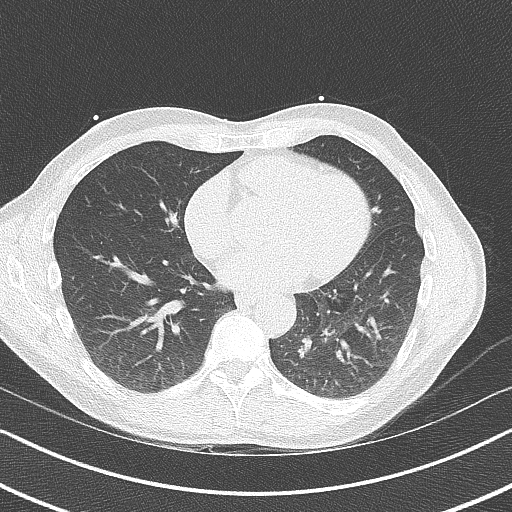
[im 33/49  lung]
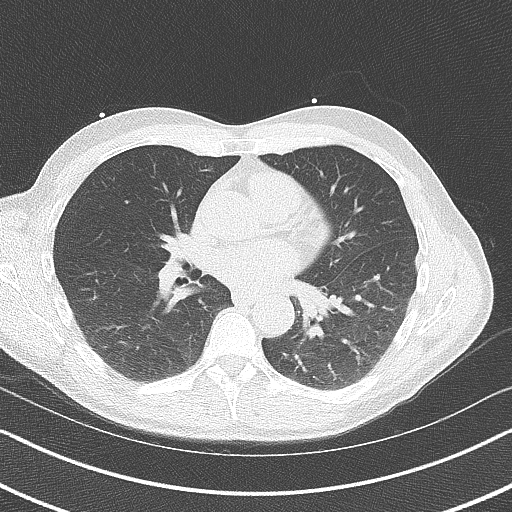
[im 41/49  lung]
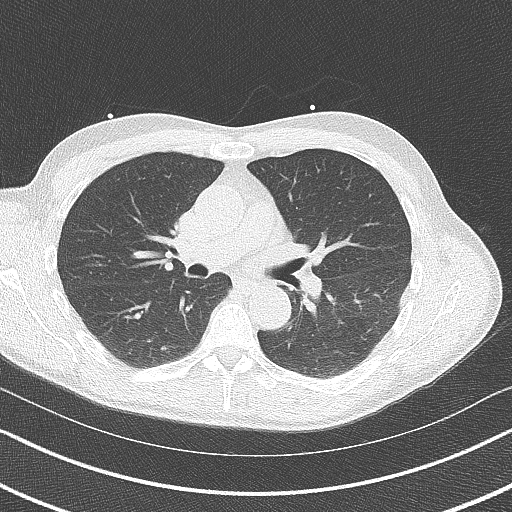

[Series 4: lung st 69 % · axial · 0.68mm/px · z∈[-270,-174]mm · 5 of 49 slices shown]
[im 9/49  lung]
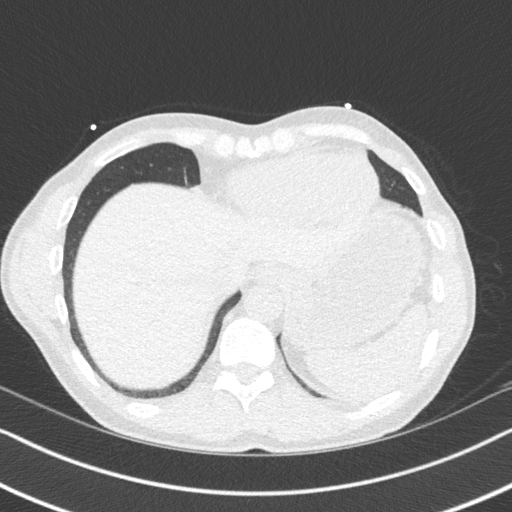
[im 17/49  lung]
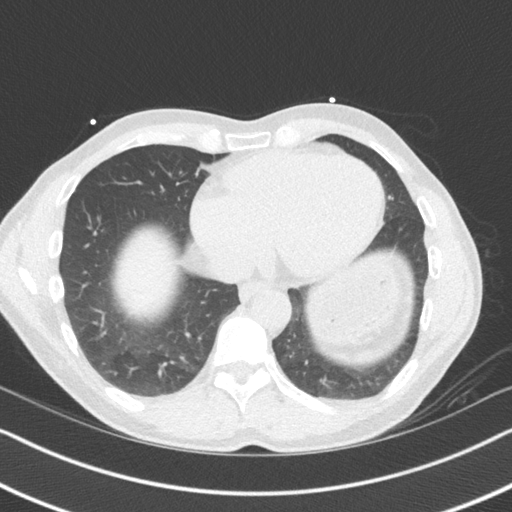
[im 25/49  lung]
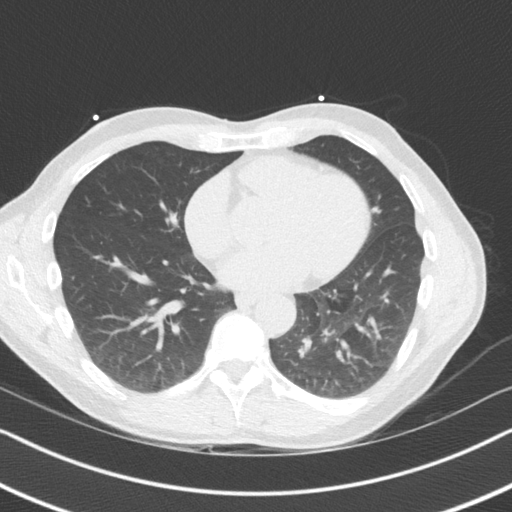
[im 33/49  lung]
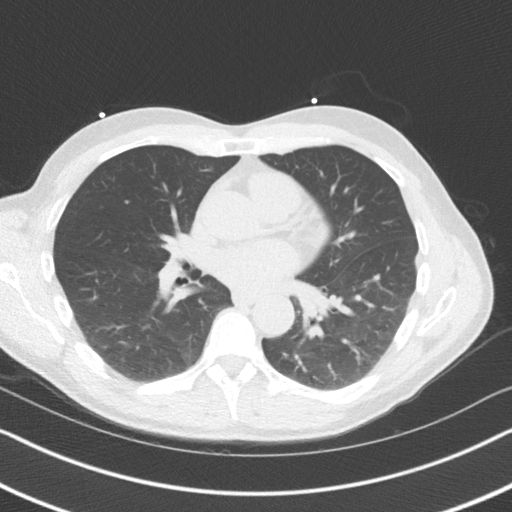
[im 41/49  lung]
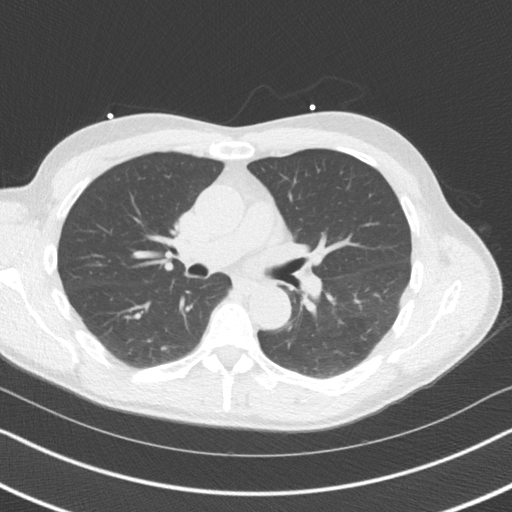

[14 of 20 positions shown; findings below may reference images not displayed]

FINDINGS: Aortic atherosclerosis. Within the visualized portions of the thorax
there are no suspicious appearing pulmonary nodules or masses, there
is no acute consolidative airspace disease, no pleural effusions, no
pneumothorax and no lymphadenopathy. Visualized portions of the
upper abdomen are unremarkable. There are no aggressive appearing
lytic or blastic lesions noted in the visualized portions of the
skeleton.
IMPRESSION: 1.  Aortic Atherosclerosis (8KUFP-B6P.P).
FINDINGS: Non-cardiac: See separate report from [REDACTED].

Ascending Aorta: Normal caliber.  No calcifications.

Pericardium: Normal

Coronary arteries: Normal coronary origins.
IMPRESSION: Coronary calcium score of 0. This was 0 percentile for age and sex
matched control.

Damarcus Palmero

*** End of Addendum ***
EXAM:
OVER-READ INTERPRETATION  CT CHEST

The following report is an over-read performed by radiologist Dr.
Meghana Fielding [REDACTED] on 01/08/2021. This
over-read does not include interpretation of cardiac or coronary
anatomy or pathology. The coronary calcium score interpretation by
the cardiologist is attached.
FINDINGS: Aortic atherosclerosis. Within the visualized portions of the thorax
there are no suspicious appearing pulmonary nodules or masses, there
is no acute consolidative airspace disease, no pleural effusions, no
pneumothorax and no lymphadenopathy. Visualized portions of the
upper abdomen are unremarkable. There are no aggressive appearing
lytic or blastic lesions noted in the visualized portions of the
skeleton.
IMPRESSION: 1.  Aortic Atherosclerosis (8KUFP-B6P.P).

## 2021-06-26 ENCOUNTER — Other Ambulatory Visit: Payer: Self-pay | Admitting: Family Medicine

## 2021-06-27 ENCOUNTER — Other Ambulatory Visit: Payer: Self-pay

## 2021-06-27 ENCOUNTER — Encounter: Payer: Self-pay | Admitting: Dermatology

## 2021-06-27 ENCOUNTER — Ambulatory Visit (INDEPENDENT_AMBULATORY_CARE_PROVIDER_SITE_OTHER): Payer: Medicare HMO | Admitting: Dermatology

## 2021-06-27 DIAGNOSIS — L820 Inflamed seborrheic keratosis: Secondary | ICD-10-CM

## 2021-06-27 DIAGNOSIS — D485 Neoplasm of uncertain behavior of skin: Secondary | ICD-10-CM

## 2021-06-27 DIAGNOSIS — D225 Melanocytic nevi of trunk: Secondary | ICD-10-CM

## 2021-06-27 NOTE — Patient Instructions (Signed)

## 2021-07-08 ENCOUNTER — Encounter: Payer: Self-pay | Admitting: Dermatology

## 2021-07-08 NOTE — Progress Notes (Signed)
   Follow-Up Visit   Subjective  Louis Vaughn is a 65 y.o. male who presents for the following: Follow-up (Per patient wife she felt like a a spot on his back was odd shaped and wants it looked at).  Growth on back left shoulder which concerns wife Location:  Duration:  Quality:  Associated Signs/Symptoms: Modifying Factors:  Severity:  Timing: Context:   Objective  Well appearing patient in no apparent distress; mood and affect are within normal limits. Left Upper Back Brown crust with history of change       All skin waist up examined.   Assessment & Plan    Neoplasm of uncertain behavior of skin Left Upper Back  Skin / nail biopsy Type of biopsy: tangential   Informed consent: discussed and consent obtained   Timeout: patient name, date of birth, surgical site, and procedure verified   Anesthesia: the lesion was anesthetized in a standard fashion   Anesthetic:  1% lidocaine w/ epinephrine 1-100,000 local infiltration Instrument used: flexible razor blade   Hemostasis achieved with: ferric subsulfate   Outcome: patient tolerated procedure well   Post-procedure details: wound care instructions given    Specimen 1 - Surgical pathology Differential Diagnosis: SK  Check Margins: No      I, Lavonna Monarch, MD, have reviewed all documentation for this visit.  The documentation on 07/08/21 for the exam, diagnosis, procedures, and orders are all accurate and complete.

## 2021-10-03 DIAGNOSIS — R972 Elevated prostate specific antigen [PSA]: Secondary | ICD-10-CM | POA: Diagnosis not present

## 2021-10-03 DIAGNOSIS — N401 Enlarged prostate with lower urinary tract symptoms: Secondary | ICD-10-CM | POA: Diagnosis not present

## 2021-10-03 DIAGNOSIS — R3915 Urgency of urination: Secondary | ICD-10-CM | POA: Diagnosis not present

## 2021-12-15 ENCOUNTER — Other Ambulatory Visit: Payer: Self-pay | Admitting: Family Medicine

## 2021-12-19 ENCOUNTER — Telehealth: Payer: Self-pay | Admitting: Family Medicine

## 2021-12-19 NOTE — Telephone Encounter (Signed)
Attempted to schedule AWV. Unable to LVM.  Will try at later time.  

## 2021-12-20 ENCOUNTER — Other Ambulatory Visit: Payer: Self-pay | Admitting: Family Medicine

## 2022-01-27 NOTE — Progress Notes (Signed)
? ?Phone [phone] ?In person visit ?  ?Subjective:  ? ?Louis Vaughn is a 66 y.o. year old very pleasant male patient who presents for/with See problem oriented charting ?Chief Complaint  ?Patient presents with  ? Follow-up  ? Hypertension  ?  Pt noticed bp and heart rate change after being given Molnupiravir in his home country.  ? ? ?This visit occurred during the SARS-CoV-2 public health emergency.  Safety protocols were in place, including screening questions prior to the visit, additional usage of staff PPE, and extensive cleaning of exam room while observing appropriate contact time as indicated for disinfecting solutions.  ? ?Past Medical History-  ?Patient Active Problem List  ? Diagnosis Date Noted  ? Aortic atherosclerosis ([place]) 01/08/2021  ?  Priority: Medium   ? Elevated PSA, less than 10 ng/ml 08/11/2016  ?  Priority: Medium   ? Hyperlipidemia 11/14/2015  ?  Priority: Medium   ? Essential hypertension, benign 09/11/2014  ?  Priority: Medium   ? Sessile colonic polyp 11/30/2019  ?  Priority: Low  ? Nasal polyps 11/22/2015  ?  Priority: Low  ? Osteoarthritis of left knee 01/16/2015  ?  Priority: Low  ? ? ?Medications- reviewed and updated ?Current Outpatient Medications  ?Medication Sig Dispense Refill  ? Glucosamine 750 MG TABS Take 2 tablets by mouth daily.    ? ketoconazole (NIZORAL) 2 % shampoo Apply to scalp daily as needed let sit 3-5 minutes then rinse. 120 mL 10  ? lisinopril (ZESTRIL) 5 MG tablet TAKE 1 TABLET BY MOUTH EVERY DAY 90 tablet 4  ? Multiple Vitamin (MULTIVITAMIN) tablet Take 1 tablet by mouth daily.    ? Omega-3 Fatty Acids (FISH OIL PO) Take by mouth.    ? ?No current facility-administered medications for this visit.  ? ?  ?Objective:  ?BP 130/70   Pulse (!) 56   Temp 99.1 ?F (37.3 ?C)   Ht '5\' 8"'$  (1.727 m)   Wt 149 lb 6.4 oz (67.8 kg)   SpO2 96%   BMI 22.72 kg/m?  ?Gen: NAD, resting comfortably ?No carotid bruit ?CV: RRR no murmurs rubs or gallops ?Lungs: CTAB no  crackles, wheeze, rhonchi ?Ext: no edema ?Skin: warm, dry ?Neuro: grossly normal, moves all extremities ? ?EKG: sinus bradycardia rhythm with rate 57, normal axis, normal intervals, no hypertrophy, no st or t wave changes- question t wave inversion but overall appears largely stable compared to 05/20/2019 ekg ? ?  ? ?Assessment and Plan  ? ?# social update - had a trip to [place] to be with mother who was over 38 years old (66 in 2023)- will be headed back in 9 days. Requiring heavy care. Considering care facility ? ?# Heart rate change after covid ?S: he had covid December of 2022 while in [place]. Did rapid and PCR- was treated with molnupiravir for 5 days. Did not have major issues with covid and recovered completely but noted change In heart rate afterwards.  ? ?Has had periods of palpitations and when he checks blood pressure after that has noted higher BPs up to 180s in beginning- that has done better and max 150s but will trend back down. HR on home checks up to 120 after covid but has trended down since time- highest around 100 and varies down to 50s range.  Mildly improving as gets further out from covid ? ?No chest pain or shortness of breath. Was not able to exercise in [place] when with his mother.  ? ?A/P:  Patient with some palpitations after COVID-wonder if he had mild myocarditis with covid (he wonders if could have been related to molnupiravir but I think the virus itself is a more likely culprit)- regardless improving at this point - opted to continue to monitor and update labs unless worsening symptoms.  No chest pain or shortness of breath or other red flags and episodes are rather fleeting it sounds like ? ?#hypertension ?S: medication: lisinopril 5 mg ? Home readings #s: 120s or low 130s for most part/70s  ?BP Readings from Last 3 Encounters:  ?02/03/22 130/70  ?12/20/20 128/84  ?06/26/20 120/68  ?A/P: Controlled. Continue current medications- lisinopril 5 mg unless #s increase more consistently-  today at goal.  ? ?#hyperlipidemia- Ct cardiac scoring of 0 in 2022 but... ?#aortic atherosclerosis ?S: Medication:none  ?Lab Results  ?Component Value Date  ? CHOL 194 12/21/2020  ? HDL 67.20 12/21/2020  ? LDLCALC 111 (H) 12/21/2020  ? TRIG 78.0 12/21/2020  ? CHOLHDL 3 12/21/2020  ? A/P: update lipid panel- ct cardiac scoring of 0 but did have aortic atherosclerosis- could consider even consider just once a week cholesterol medicine. Also wants to work on lifestyle ? ?Recommended follow up: Return in about 6 months (around 08/05/2022) for physical or sooner if needed.Schedule b4 you leave. ? ?Lab/Order associations: ?  ICD-10-CM   ?1. Essential hypertension, benign  I10   ?  ?2. Hyperlipidemia, unspecified hyperlipidemia type  E78.5 CBC with Differential/Platelet  ?  Comprehensive metabolic panel  ?  Lipid panel  ?  TSH  ?  ?3. Increased heart rate  R00.0 EKG 12-Lead  ?  ?4. Nocturia  R35.1 PSA  ?  ? ?No orders of the defined types were placed in this encounter. ? ? ?Return precautions advised.  ?Garret Reddish, MD ? ? ?

## 2022-02-03 ENCOUNTER — Ambulatory Visit (INDEPENDENT_AMBULATORY_CARE_PROVIDER_SITE_OTHER): Payer: Medicare HMO | Admitting: Family Medicine

## 2022-02-03 ENCOUNTER — Other Ambulatory Visit: Payer: Medicare HMO

## 2022-02-03 ENCOUNTER — Encounter: Payer: Self-pay | Admitting: Family Medicine

## 2022-02-03 VITALS — BP 130/70 | HR 56 | Temp 99.1°F | Ht 68.0 in | Wt 149.4 lb

## 2022-02-03 DIAGNOSIS — I700 Atherosclerosis of aorta: Secondary | ICD-10-CM

## 2022-02-03 DIAGNOSIS — R000 Tachycardia, unspecified: Secondary | ICD-10-CM

## 2022-02-03 DIAGNOSIS — E785 Hyperlipidemia, unspecified: Secondary | ICD-10-CM | POA: Diagnosis not present

## 2022-02-03 DIAGNOSIS — R351 Nocturia: Secondary | ICD-10-CM | POA: Diagnosis not present

## 2022-02-03 DIAGNOSIS — I10 Essential (primary) hypertension: Secondary | ICD-10-CM

## 2022-02-03 NOTE — Patient Instructions (Addendum)
Health Maintenance Due  ?Topic Date Due  ? Pneumonia Vaccine 55+ Years old (2 - PPSV23 if available, else PCV20)- consider in the fall 03/20/2022  ? ?Schedule a lab visit at the check out desk within 2 weeks. Return for future fasting labs meaning nothing but water after midnight please. Ok to take your medications with water.  ? ?Recommended follow up: Return in about 6 months (around 08/05/2022) for physical or sooner if needed.Schedule b4 you leave. ?- let us know if you have new or worsening symptoms- otherwise suspect this will continue to gradually improve ?

## 2022-02-10 ENCOUNTER — Other Ambulatory Visit (INDEPENDENT_AMBULATORY_CARE_PROVIDER_SITE_OTHER): Payer: Medicare HMO

## 2022-02-10 DIAGNOSIS — E785 Hyperlipidemia, unspecified: Secondary | ICD-10-CM

## 2022-02-10 DIAGNOSIS — R351 Nocturia: Secondary | ICD-10-CM | POA: Diagnosis not present

## 2022-02-10 LAB — LIPID PANEL
Cholesterol: 193 mg/dL (ref 0–200)
HDL: 69.3 mg/dL (ref >39.00)
LDL Cholesterol: 110 mg/dL — ABNORMAL HIGH (ref 0–99)
NonHDL: 124.16
Total CHOL/HDL Ratio: 3
Triglycerides: 71 mg/dL (ref 0.0–149.0)
VLDL: 14.2 mg/dL (ref 0.0–40.0)

## 2022-02-10 LAB — CBC WITH DIFFERENTIAL/PLATELET
Basophils Absolute: 0 10*3/uL (ref 0.0–0.1)
Basophils Relative: 0.4 % (ref 0.0–3.0)
Eosinophils Absolute: 0.1 10*3/uL (ref 0.0–0.7)
Eosinophils Relative: 1.5 % (ref 0.0–5.0)
HCT: 42.5 % (ref 39.0–52.0)
Hemoglobin: 14.4 g/dL (ref 13.0–17.0)
Lymphocytes Relative: 29.7 % (ref 12.0–46.0)
Lymphs Abs: 1.5 10*3/uL (ref 0.7–4.0)
MCHC: 33.8 g/dL (ref 30.0–36.0)
MCV: 91 fl (ref 78.0–100.0)
Monocytes Absolute: 0.4 10*3/uL (ref 0.1–1.0)
Monocytes Relative: 8.4 % (ref 3.0–12.0)
Neutro Abs: 3 10*3/uL (ref 1.4–7.7)
Neutrophils Relative %: 60 % (ref 43.0–77.0)
Platelets: 209 10*3/uL (ref 150.0–400.0)
RBC: 4.67 Mil/uL (ref 4.22–5.81)
RDW: 12.9 % (ref 11.5–15.5)
WBC: 5 10*3/uL (ref 4.0–10.5)

## 2022-02-10 LAB — PSA: PSA: 1.92 ng/mL (ref 0.10–4.00)

## 2022-02-10 LAB — TSH: TSH: 4.2 u[IU]/mL (ref 0.35–5.50)

## 2022-02-10 LAB — COMPREHENSIVE METABOLIC PANEL
ALT: 21 U/L (ref 0–53)
AST: 22 U/L (ref 0–37)
Albumin: 4.2 g/dL (ref 3.5–5.2)
Alkaline Phosphatase: 52 U/L (ref 39–117)
BUN: 13 mg/dL (ref 6–23)
CO2: 26 mEq/L (ref 19–32)
Calcium: 9.1 mg/dL (ref 8.4–10.5)
Chloride: 102 mEq/L (ref 96–112)
Creatinine, Ser: 0.91 mg/dL (ref 0.40–1.50)
GFR: 87.89 mL/min (ref >60.00)
Glucose, Bld: 103 mg/dL — ABNORMAL HIGH (ref 70–99)
Potassium: 3.9 mEq/L (ref 3.5–5.1)
Sodium: 138 mEq/L (ref 135–145)
Total Bilirubin: 0.7 mg/dL (ref 0.2–1.2)
Total Protein: 6.3 g/dL (ref 6.0–8.3)

## 2022-02-28 ENCOUNTER — Telehealth: Payer: Self-pay

## 2022-02-28 NOTE — Telephone Encounter (Signed)
Patient is calling in for lab results.   Louis Vaughn will follow up in regard

## 2022-02-28 NOTE — Telephone Encounter (Signed)
Called and spoke with pt and labs reviewed. 

## 2022-07-28 ENCOUNTER — Encounter: Payer: Self-pay | Admitting: *Deleted

## 2022-08-19 ENCOUNTER — Ambulatory Visit (INDEPENDENT_AMBULATORY_CARE_PROVIDER_SITE_OTHER): Payer: Medicare HMO | Admitting: Family Medicine

## 2022-08-19 ENCOUNTER — Encounter: Payer: Self-pay | Admitting: Family Medicine

## 2022-08-19 VITALS — BP 122/78 | HR 66 | Temp 98.0°F | Ht 68.0 in | Wt 155.8 lb

## 2022-08-19 DIAGNOSIS — Z0000 Encounter for general adult medical examination without abnormal findings: Secondary | ICD-10-CM | POA: Diagnosis not present

## 2022-08-19 DIAGNOSIS — R972 Elevated prostate specific antigen [PSA]: Secondary | ICD-10-CM | POA: Diagnosis not present

## 2022-08-19 DIAGNOSIS — E785 Hyperlipidemia, unspecified: Secondary | ICD-10-CM

## 2022-08-19 DIAGNOSIS — I499 Cardiac arrhythmia, unspecified: Secondary | ICD-10-CM | POA: Diagnosis not present

## 2022-08-19 NOTE — Patient Instructions (Addendum)
Schedule a lab visit at the check out desk for tomorrow if possible. Return for future fasting labs meaning nothing but water after midnight please. Ok to take your medications with water.   We will call you within two weeks about your referral to cardiology- (trying to get you in for a visit before you leave if possible just to have established relationship and see what they need). If you do not hear within 2 weeks, give [place] a call.    Recommended follow up: Return in about 6 months (around 02/18/2023) for followup or sooner if needed.Schedule b4 you leave.

## 2022-08-19 NOTE — Progress Notes (Signed)
Phone: [phone]   Subjective:  Patient presents today for their annual physical. Chief complaint-noted.   See problem oriented charting- ROS- full  review of systems was completed and negative  except for: cough recovering from bronchitis, occasional palpitations- btu beter on med  The following were reviewed and entered/updated in epic: Past Medical History:  Diagnosis Date   Atypical mole 03/20/2006   Right Lower Abdomen (slight to moderate)   Atypical mole 10/16/2005   Left Low Outer Back (moderate)   Atypical mole 10/16/2005   Low Mid Back (slight)   Hypertension    Left elbow tendonitis    Osteoarthritis of left knee    knees   Patient Active Problem List   Diagnosis Date Noted   Aortic atherosclerosis ([place]) 01/08/2021    Priority: Medium    Elevated PSA, less than 10 ng/ml 08/11/2016    Priority: Medium    Hyperlipidemia 11/14/2015    Priority: Medium    Essential hypertension, benign 09/11/2014    Priority: Medium    Sessile colonic polyp 11/30/2019    Priority: Low   Nasal polyps 11/22/2015    Priority: Low   Osteoarthritis of left knee 01/16/2015    Priority: Low   Past Surgical History:  Procedure Laterality Date   COLONOSCOPY     none     POLYPECTOMY     WISDOM TOOTH EXTRACTION      Family History  Problem Relation Age of Onset   Hypertension Mother    CVA Father        66   Sleep apnea Brother    Colon cancer Neg Hx    Esophageal cancer Neg Hx    Rectal cancer Neg Hx    Stomach cancer Neg Hx    Colon polyps Neg Hx     Medications- reviewed and updated Current Outpatient Medications  Medication Sig Dispense Refill   Acetylcysteine 600 MG CAPS Take by mouth.     amoxicillin-clavulanate (AUGMENTIN) 500-125 MG tablet Take 1 tablet by mouth 3 (three) times daily.     atorvastatin (LIPITOR) 20 MG tablet Take 20 mg by mouth daily.     LACTOBACILLUS BIFIDUS PO Take by mouth.     metoprolol succinate (TOPROL-XL) 25 MG 24 hr tablet Take 25  mg by mouth daily. Started in [place]     rivaroxaban (XARELTO) 20 MG TABS tablet Take 20 mg by mouth daily with supper.     Glucosamine 750 MG TABS Take 2 tablets by mouth daily.     ketoconazole (NIZORAL) 2 % shampoo Apply to scalp daily as needed let sit 3-5 minutes then rinse. 120 mL 10   lisinopril (ZESTRIL) 5 MG tablet TAKE 1 TABLET BY MOUTH EVERY DAY 90 tablet 4   Multiple Vitamin (MULTIVITAMIN) tablet Take 1 tablet by mouth daily.     Omega-3 Fatty Acids (FISH OIL PO) Take by mouth.     No current facility-administered medications for this visit.    Allergies-reviewed and updated No Known Allergies  Social History   Social History Narrative   Married (not sure if wife a patient here). 1 daughter 18 lives in [place] in 2016.       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 Oropharynx normal Neck: no thyromegaly CV: RRR no murmurs rubs or gallops Lungs: CTAB no crackles, wheeze, rhonchi Abdomen: soft/nontender/nondistended/normal bowel sounds. No rebound or guarding.  Ext: no edema Skin: warm, dry Neuro: grossly normal, moves all extremities, PERRLA    Assessment and Plan  66 y.o. male presenting for annual physical.  Health Maintenance counseling: 1. Anticipatory guidance: Patient counseled regarding regular dental exams -q6 months, eye exams - will try to get yearly when returns,  avoiding smoking and second hand smoke , limiting alcohol to 2 beverages per day -1-4 per week, no illicit drugs .   2. Risk factor reduction:  Advised patient of need for regular exercise and diet rich and fruits and vegetables to reduce risk of heart attack and stroke.  Exercise-  doing similar exercise in [place] - basically [employer].  Diet/weight management-reasonably healthy diet.  Wt Readings from Last 3 Encounters:  08/19/22 155 lb 12.8 oz (70.7 kg)  02/03/22 149 lb 6.4 oz (67.8 kg)  12/20/20 145 lb 3.2 oz (65.9 kg)  3. Immunizations/screenings/ancillary studies- considering covid shot (he prefers to update even with cardiac history), consider shingrix at pharmacy, prevnar 20 in April planned Immunization History  Administered Date(s) Administered   Fluad Quad(high Dose 65+) 08/02/2020   Influenza,inj,Quad PF,6+ Mos 08/16/2019, 07/23/2022   PFIZER(Purple Top)SARS-COV-2 Vaccination 12/30/2019, 01/20/2020, 10/07/2020, 09/12/2021   Pneumococcal Conjugate-13 03/20/2021   Td 06/14/2009   Tdap 08/16/2019  4. Prostate cancer screening-  also reports reassuring Psa in [place]- we will recheck but overall trend encouraging Lab Results  Component Value Date   PSA 1.92 02/10/2022   PSA 3.1 06/26/2020   PSA 1.69 05/20/2019   5. Colon cancer screening - 11/25/19 with 5 year repeat planned 6. Skin cancer screening- will schedule with Dr. Denna Haggard when returns. advised regular sunscreen use. Denies worrisome, changing, or new skin lesions.  7. Smoking associated screening (lung cancer screening, AAA screen 65-75, UA)- never smoker 8. STD screening - only active with wife  Status of chronic or acute concerns   #bronchitis- recovering from this and improving on augmentin and probiotic.   # Irregular heartbeat S: Patient reports after his last COVID shot/vaccination in [place] he developed irregular heartbeat . Apparently comes and goes- had holter monitor prior to going to hospital that was normal.   Appears to be on antiarrhythmic Lappaconitini hydrobromidum 25 mg  3 x day as well as metoprolol succinate 25 mg   Anticoagulated with Xarelto 15 mg (was on 20 mg at first) A/P: Unknown diagnosis but reports irregular heartbeat requiring antiarrhythmic and xarelto (suspect a fib)  -starting after covid shot in [place]- we will refer to local cardiologist -no changes made at this time- continue current meds  #hypertension S: medication: lisinopril 5 mg, metoprolol 25 mg XR BP Readings from Last 3 Encounters:  08/19/22 138/80  02/03/22 130/70  12/20/20 128/84  A/P: reasonable control- continue current medicine  #hyperlipidemia S: Medication:atorvastatin 20 mg daily - also takes fish oil Lab Results  Component Value Date   CHOL 193 02/10/2022   HDL 69.30 02/10/2022   LDLCALC 110 (H) 02/10/2022   TRIG 71.0 02/10/2022   CHOLHDL 3 02/10/2022   A/P: started on this by [place] physician in April- we will update lipids and monitor - tsh was checked in [place] (high after prior meds in [place] but improved- wonder if this was amiodarone) but we  will check here  Recommended follow up: Return in about 6 months (around 02/18/2023) for followup or sooner if needed.Schedule b4 you leave.  Lab/Order associations:NOT fasting   ICD-10-CM   1. Preventative health care  Z00.00     2. Irregular heartbeat  I49.9 Ambulatory referral to Cardiology    CANCELED: Ambulatory referral to Cardiology    3. Hyperlipidemia, unspecified hyperlipidemia type  E78.5     4. Elevated PSA, less than 10 ng/ml  R97.20       No orders of the defined types were placed in this encounter.   Return precautions advised.  Garret Reddish, MD

## 2022-08-20 ENCOUNTER — Encounter: Payer: Self-pay | Admitting: Internal Medicine

## 2022-08-20 ENCOUNTER — Other Ambulatory Visit (INDEPENDENT_AMBULATORY_CARE_PROVIDER_SITE_OTHER): Payer: Medicare HMO

## 2022-08-20 ENCOUNTER — Ambulatory Visit: Payer: Medicare HMO | Attending: Internal Medicine | Admitting: Internal Medicine

## 2022-08-20 VITALS — BP 124/68 | HR 66 | Ht 68.0 in | Wt 158.6 lb

## 2022-08-20 DIAGNOSIS — I10 Essential (primary) hypertension: Secondary | ICD-10-CM | POA: Diagnosis not present

## 2022-08-20 DIAGNOSIS — E782 Mixed hyperlipidemia: Secondary | ICD-10-CM | POA: Diagnosis not present

## 2022-08-20 DIAGNOSIS — I480 Paroxysmal atrial fibrillation: Secondary | ICD-10-CM

## 2022-08-20 DIAGNOSIS — R972 Elevated prostate specific antigen [PSA]: Secondary | ICD-10-CM | POA: Diagnosis not present

## 2022-08-20 DIAGNOSIS — E785 Hyperlipidemia, unspecified: Secondary | ICD-10-CM | POA: Diagnosis not present

## 2022-08-20 DIAGNOSIS — Z7901 Long term (current) use of anticoagulants: Secondary | ICD-10-CM | POA: Diagnosis not present

## 2022-08-20 LAB — COMPREHENSIVE METABOLIC PANEL
ALT: 24 U/L (ref 0–53)
AST: 23 U/L (ref 0–37)
Albumin: 4.1 g/dL (ref 3.5–5.2)
Alkaline Phosphatase: 53 U/L (ref 39–117)
BUN: 21 mg/dL (ref 6–23)
CO2: 29 mEq/L (ref 19–32)
Calcium: 9.3 mg/dL (ref 8.4–10.5)
Chloride: 105 mEq/L (ref 96–112)
Creatinine, Ser: 1.06 mg/dL (ref 0.40–1.50)
GFR: 72.92 mL/min (ref >60.00)
Glucose, Bld: 108 mg/dL — ABNORMAL HIGH (ref 70–99)
Potassium: 5.3 mEq/L — ABNORMAL HIGH (ref 3.5–5.1)
Sodium: 139 mEq/L (ref 135–145)
Total Bilirubin: 0.6 mg/dL (ref 0.2–1.2)
Total Protein: 6.5 g/dL (ref 6.0–8.3)

## 2022-08-20 LAB — LIPID PANEL
Cholesterol: 157 mg/dL (ref 0–200)
HDL: 59.1 mg/dL (ref >39.00)
LDL Cholesterol: 86 mg/dL (ref 0–99)
NonHDL: 97.68
Total CHOL/HDL Ratio: 3
Triglycerides: 56 mg/dL (ref 0.0–149.0)
VLDL: 11.2 mg/dL (ref 0.0–40.0)

## 2022-08-20 LAB — CBC WITH DIFFERENTIAL/PLATELET
Basophils Absolute: 0 10*3/uL (ref 0.0–0.1)
Basophils Relative: 0.3 % (ref 0.0–3.0)
Eosinophils Absolute: 0 10*3/uL (ref 0.0–0.7)
Eosinophils Relative: 0.7 % (ref 0.0–5.0)
HCT: 43.3 % (ref 39.0–52.0)
Hemoglobin: 14.6 g/dL (ref 13.0–17.0)
Lymphocytes Relative: 43.7 % (ref 12.0–46.0)
Lymphs Abs: 2.2 10*3/uL (ref 0.7–4.0)
MCHC: 33.6 g/dL (ref 30.0–36.0)
MCV: 91.6 fl (ref 78.0–100.0)
Monocytes Absolute: 0.4 10*3/uL (ref 0.1–1.0)
Monocytes Relative: 9 % (ref 3.0–12.0)
Neutro Abs: 2.3 10*3/uL (ref 1.4–7.7)
Neutrophils Relative %: 46.3 % (ref 43.0–77.0)
Platelets: 214 10*3/uL (ref 150.0–400.0)
RBC: 4.73 Mil/uL (ref 4.22–5.81)
RDW: 12.8 % (ref 11.5–15.5)
WBC: 5 10*3/uL (ref 4.0–10.5)

## 2022-08-20 LAB — TSH: TSH: 3.25 u[IU]/mL (ref 0.35–5.50)

## 2022-08-20 LAB — PSA: PSA: 2.64 ng/mL (ref 0.10–4.00)

## 2022-08-20 NOTE — Patient Instructions (Signed)
Medication Instructions:  OK to take extra metoprolol as needed for breakthrough atrial fibrillation   *If you need a refill on your cardiac medications before your next appointment, please call your pharmacy*    Follow-Up: At [facility], you and your health needs are our priority.  As part of our continuing mission to provide you with exceptional heart care, we have created designated Provider Care Teams.  These Care Teams include your primary Cardiologist (physician) and Advanced Practice Providers (APPs -  Physician Assistants and Nurse Practitioners) who all work together to provide you with the care you need, when you need it.  We recommend signing up for the patient portal called "MyChart".  Sign up information is provided on this After Visit Summary.  MyChart is used to connect with patients for Virtual Visits (Telemedicine).  Patients are able to view lab/test results, encounter notes, upcoming appointments, etc.  Non-urgent messages can be sent to your provider as well.   To learn more about what you can do with MyChart, go to [email or web address].    Your next appointment:    6 months with Dr. Debara Pickett

## 2022-08-21 NOTE — Progress Notes (Signed)
OFFICE CONSULT NOTE  Chief Complaint:  Evaluate and manage arrhythmia  Primary Care Physician: Marin Olp, MD  HPI:  Louis Vaughn is a 66 y.o. male who is being seen today for the evaluation of arrhythmia at the request of Marin Olp, MD. this is a pleasant 66 year old [place] male who had establish care with Dr. Yong Channel.  He works at the [employer].  He is frequently returning to [place], it sounds like to take care of his mother's estate as she may have recently passed.  While he was in [place], he had an episode of tachycardia and palpitations.  He was seen in the hospital and said that his heart rate was over 150.  He was given medicine through the IV and ultimately was able to get back to normal rhythm.  It sounds like he was in atrial fibrillation.  He had been seen by cardiologist once or twice and might have had an echocardiogram.  They initially had placed him on a medication called Lappaconitine hydrobromium 25 mg TID and then added metoprolol succinate 25 mg daily.  A literature search shows that Lappaconitine is a sodium channel blocker but could be used as an anesthetic but probably has some antiarrhythmic properties.  It could be considered similar to quinidine.  It would be an older medication with little evidence of efficacy.  There was a review in 2016 translated from [place] that I could find suggesting that there were less than 400 patients in the literature on the medication and the any studies showing benefit were under powered and not well performed.  In addition, the medication did not make the European Society of cardiology A-fib treatment guidelines.  Since being back in [place], he has had several breakthrough episodes of palpitations but not as significant.  He was also placed on Xarelto anticoagulation, initially at 15 mg daily then 20 mg daily.  PMHx:  Past Medical History:  Diagnosis Date   Atypical mole 03/20/2006   Right  Lower Abdomen (slight to moderate)   Atypical mole 10/16/2005   Left Low Outer Back (moderate)   Atypical mole 10/16/2005   Low Mid Back (slight)   Hypertension    Left elbow tendonitis    Osteoarthritis of left knee    knees    Past Surgical History:  Procedure Laterality Date   COLONOSCOPY     none     POLYPECTOMY     WISDOM TOOTH EXTRACTION      FAMHx:  Family History  Problem Relation Age of Onset   Hypertension Mother    Kidney disease Mother        died 69- had worsening function   CVA Father        66   Sleep apnea Brother    Diabetes Brother    Colon cancer Neg Hx    Esophageal cancer Neg Hx    Rectal cancer Neg Hx    Stomach cancer Neg Hx    Colon polyps Neg Hx     SOCHx:   reports that he has never smoked. He has never used smokeless tobacco. He reports that he does not currently use alcohol. He reports that he does not use drugs.  ALLERGIES:  No Known Allergies  ROS: Pertinent items noted in HPI and remainder of comprehensive ROS otherwise negative.  HOME MEDS: Current Outpatient Medications on File Prior to Visit  Medication Sig Dispense Refill   Acetylcysteine 600 MG CAPS Take by  mouth.     amoxicillin-clavulanate (AUGMENTIN) 500-125 MG tablet Take 1 tablet by mouth 3 (three) times daily.     atorvastatin (LIPITOR) 20 MG tablet Take 20 mg by mouth daily.     Glucosamine 750 MG TABS Take 2 tablets by mouth daily.     ketoconazole (NIZORAL) 2 % shampoo Apply to scalp daily as needed let sit 3-5 minutes then rinse. 120 mL 10   LACTOBACILLUS BIFIDUS PO Take by mouth.     lisinopril (ZESTRIL) 5 MG tablet TAKE 1 TABLET BY MOUTH EVERY DAY 90 tablet 4   metoprolol succinate (TOPROL-XL) 25 MG 24 hr tablet Take 25 mg by mouth daily. Started in [place]     Multiple Vitamin (MULTIVITAMIN) tablet Take 1 tablet by mouth daily.     NON FORMULARY Lappaconitine hydrobromide     Omega-3 Fatty Acids (FISH OIL PO) Take by mouth.     rivaroxaban (XARELTO) 20 MG TABS  tablet Take 20 mg by mouth daily with supper.     No current facility-administered medications on file prior to visit.    LABS/IMAGING: Results for orders placed or performed in visit on 08/20/22 (from the past 48 hour(s))  TSH     Status: None   Collection Time: 08/20/22  9:54 AM  Result Value Ref Range   TSH 3.25 0.35 - 5.50 uIU/mL  PSA     Status: None   Collection Time: 08/20/22  9:54 AM  Result Value Ref Range   PSA 2.64 0.10 - 4.00 ng/mL    Comment: Test performed using Access Hybritech PSA Assay, a parmagnetic partical, chemiluminecent immunoassay.  Lipid panel     Status: None   Collection Time: 08/20/22  9:54 AM  Result Value Ref Range   Cholesterol 157 0 - 200 mg/dL    Comment: ATP III Classification       Desirable:  < 200 mg/dL               Borderline High:  200 - 239 mg/dL          High:  > = 240 mg/dL   Triglycerides 56.0 0.0 - 149.0 mg/dL    Comment: Normal:  <150 mg/dLBorderline High:  150 - 199 mg/dL   HDL 59.10 >39.00 mg/dL   VLDL 11.2 0.0 - 40.0 mg/dL   LDL Cholesterol 86 0 - 99 mg/dL   Total CHOL/HDL Ratio 3     Comment:                Men          Women1/2 Average Risk     3.4          3.3Average Risk          5.0          4.42X Average Risk          9.6          7.13X Average Risk          15.0          11.0                       NonHDL 97.68     Comment: NOTE:  Non-HDL goal should be 30 mg/dL higher than patient's LDL goal (i.e. LDL goal of < 70 mg/dL, would have non-HDL goal of < 100 mg/dL)  Comprehensive metabolic panel     Status: Abnormal   Collection Time: 08/20/22  9:54  AM  Result Value Ref Range   Sodium 139 135 - 145 mEq/L   Potassium 5.3 No hemolysis seen (H) 3.5 - 5.1 mEq/L   Chloride 105 96 - 112 mEq/L   CO2 29 19 - 32 mEq/L   Glucose, Bld 108 (H) 70 - 99 mg/dL   BUN 21 6 - 23 mg/dL   Creatinine, Ser 1.06 0.40 - 1.50 mg/dL   Total Bilirubin 0.6 0.2 - 1.2 mg/dL   Alkaline Phosphatase 53 39 - 117 U/L   AST 23 0 - 37 U/L   ALT 24 0 - 53 U/L    Total Protein 6.5 6.0 - 8.3 g/dL   Albumin 4.1 3.5 - 5.2 g/dL   GFR 72.92 >60.00 mL/min    Comment: Calculated using the CKD-EPI Creatinine Equation (2021)   Calcium 9.3 8.4 - 10.5 mg/dL  CBC with Differential/Platelet     Status: None   Collection Time: 08/20/22  9:54 AM  Result Value Ref Range   WBC 5.0 4.0 - 10.5 K/uL   RBC 4.73 4.22 - 5.81 Mil/uL   Hemoglobin 14.6 13.0 - 17.0 g/dL   HCT 43.3 39.0 - 52.0 %   MCV 91.6 78.0 - 100.0 fl   MCHC 33.6 30.0 - 36.0 g/dL   RDW 12.8 11.5 - 15.5 %   Platelets 214.0 150.0 - 400.0 K/uL   Neutrophils Relative % 46.3 43.0 - 77.0 %   Lymphocytes Relative 43.7 12.0 - 46.0 %   Monocytes Relative 9.0 3.0 - 12.0 %   Eosinophils Relative 0.7 0.0 - 5.0 %   Basophils Relative 0.3 0.0 - 3.0 %   Neutro Abs 2.3 1.4 - 7.7 K/uL   Lymphs Abs 2.2 0.7 - 4.0 K/uL   Monocytes Absolute 0.4 0.1 - 1.0 K/uL   Eosinophils Absolute 0.0 0.0 - 0.7 K/uL   Basophils Absolute 0.0 0.0 - 0.1 K/uL   No results found.  LIPID PANEL:    Component Value Date/Time   CHOL 157 08/20/2022 0954   TRIG 56.0 08/20/2022 0954   HDL 59.10 08/20/2022 0954   CHOLHDL 3 08/20/2022 0954   VLDL 11.2 08/20/2022 0954   LDLCALC 86 08/20/2022 0954   LDLCALC 134 (H) 06/26/2020 1041    WEIGHTS: Wt Readings from Last 3 Encounters:  08/20/22 158 lb 9.6 oz (71.9 kg)  08/19/22 155 lb 12.8 oz (70.7 kg)  02/03/22 149 lb 6.4 oz (67.8 kg)    VITALS: BP 124/68   Pulse 66   Ht '5\' 8"'$  (1.727 m)   Wt 158 lb 9.6 oz (71.9 kg)   SpO2 97%   BMI 24.12 kg/m   EXAM: General appearance: alert and no distress Neck: no carotid bruit, no JVD, and thyroid not enlarged, symmetric, no tenderness/mass/nodules Lungs: clear to auscultation bilaterally Heart: regular rate and rhythm, S1, S2 normal, no murmur, click, rub or gallop Abdomen: soft, non-tender; bowel sounds normal; no masses,  no organomegaly Extremities: extremities normal, atraumatic, no cyanosis or edema Pulses: 2+ and symmetric Skin:  Skin color, texture, turgor normal. No rashes or lesions Neurologic: Grossly normal Psych: Pleasant  EKG: Declined - EKG from 02/2022 showed sinus bradycardia, normal QTc  ASSESSMENT: Probable paroxysmal atrial fibrillation, CHADSVASC score of 2 Anticoagulated on Xarelto  PLAN: 1.   Mr. Lasch sounds like had an atrial fibrillation episode with rapid ventricular response.  He ultimately was placed on what sounds like an older antiarrhythmic medication and metoprolol and started on Xarelto for anticoagulation.  He has had some breakthrough  palpitations recently.  I advised placing a monitor to see if we can capture the abnormal rhythm and his burden of atrial fibrillation however he said he would be traveling very soon to [place] and not returning until March.  He did not want to do any further testing at this time.  I advised if he has any breakthrough that he could consider taking additional Toprol or even doubling the dose to 50 mg daily.  As there is little evidence of efficacy in the literature of Lappaconinitine hydrobromide, I would consider discontinuing it.  Plan follow-up with me after he returns from [place] in about 6 months.  Thanks for the kind referral.  Louis Casino, MD, FACC, Louis Vaughn Director of the [facility] Diplomate of the American Board of Clinical Lipidology Attending Cardiologist  Direct Dial: [phone]  Fax: [phone]  Website:  [email or web address] 08/21/2022, 8:19 AM

## 2022-08-22 ENCOUNTER — Other Ambulatory Visit: Payer: Self-pay

## 2022-08-22 DIAGNOSIS — E876 Hypokalemia: Secondary | ICD-10-CM

## 2022-10-09 ENCOUNTER — Encounter: Payer: Self-pay | Admitting: Family Medicine

## 2022-10-09 DIAGNOSIS — R972 Elevated prostate specific antigen [PSA]: Secondary | ICD-10-CM

## 2023-02-02 DIAGNOSIS — R972 Elevated prostate specific antigen [PSA]: Secondary | ICD-10-CM | POA: Diagnosis not present

## 2023-02-03 ENCOUNTER — Encounter: Payer: Self-pay | Admitting: Nurse Practitioner

## 2023-02-03 ENCOUNTER — Ambulatory Visit: Payer: Medicare HMO | Attending: Internal Medicine | Admitting: Nurse Practitioner

## 2023-02-03 VITALS — BP 102/60 | HR 57 | Ht 67.0 in | Wt 159.0 lb

## 2023-02-03 DIAGNOSIS — I10 Essential (primary) hypertension: Secondary | ICD-10-CM | POA: Diagnosis not present

## 2023-02-03 DIAGNOSIS — Z7901 Long term (current) use of anticoagulants: Secondary | ICD-10-CM

## 2023-02-03 DIAGNOSIS — I472 Ventricular tachycardia, unspecified: Secondary | ICD-10-CM

## 2023-02-03 DIAGNOSIS — I480 Paroxysmal atrial fibrillation: Secondary | ICD-10-CM | POA: Diagnosis not present

## 2023-02-03 NOTE — Progress Notes (Signed)
Office Visit    Patient Name: Louis Vaughn Date of Encounter: 02/03/2023  Primary Care Provider:  Marin Olp, MD Primary Cardiologist:  Pixie Casino, MD  Chief Complaint    67 year old male with a history of paroxysmal atrial fibrillation and hypertension who presents for follow-up related to atrial fibrillation.   Past Medical History    Past Medical History:  Diagnosis Date   Atypical mole 03/20/2006   Right Lower Abdomen (slight to moderate)   Atypical mole 10/16/2005   Left Low Outer Back (moderate)   Atypical mole 10/16/2005   Low Mid Back (slight)   Hypertension    Left elbow tendonitis    Osteoarthritis of left knee    knees   Past Surgical History:  Procedure Laterality Date   COLONOSCOPY     none     POLYPECTOMY     WISDOM TOOTH EXTRACTION      Allergies  No Known Allergies   Labs/Other Studies Reviewed    The following studies were reviewed today: CT cardiac scoring 01/2021:  FINDINGS: Non-cardiac: See separate report from [facility].   Ascending Aorta: Normal caliber.  No calcifications.   Pericardium: Normal   Coronary arteries: Normal coronary origins.   IMPRESSION: Coronary calcium score of 0. This was 0 percentile for age and sex matched control.   Traci Turner    Recent Labs: 08/20/2022: ALT 24; BUN 21; Creatinine, Ser 1.06; Hemoglobin 14.6; Platelets 214.0; Potassium 5.3 No hemolysis seen; Sodium 139; TSH 3.25  Recent Lipid Panel    Component Value Date/Time   CHOL 157 08/20/2022 0954   TRIG 56.0 08/20/2022 0954   HDL 59.10 08/20/2022 0954   CHOLHDL 3 08/20/2022 0954   VLDL 11.2 08/20/2022 0954   LDLCALC 86 08/20/2022 0954   LDLCALC 134 (H) 06/26/2020 1041    History of Present Illness    67 year old male with the above past medial history including paroxysmal atrial fibrillation and hypertension.  He was hospitalized during a trip to [place] in the setting of new onset atrial fibrillation.  He was  started on Xarelto, Lappaconitine hydrobromium and metoprolol.  He saw Dr. Debara Pickett on 08/16/2022 and was stable from a cardiac standpoint. Lappaconitine hydrobromium was discontinued.  He declined any additional cardiac testing at that time.  It was noted that should he have breakthrough atrial fibrillation, sooner increasing metoprolol to 50 mg daily.  He presents today for follow-up.  Since his last visit he has been stable overall from a cardiac standpoint.  He just returned from [place] 5 days ago.  While in [place], he saw a cardiologist and wore a heart monitor which reportedly showed some atrial fibrillation, ventricular tachycardia.  He was started on sotalol.  Stress test was reportedly normal.  He is going back to [place] in the next week to tend to some personal business.  He plans to return to the [place] in October.  He denies any palpitations, dizziness, chest pain, dyspnea.  Overall, he reports feeling well.  Home Medications    Current Outpatient Medications  Medication Sig Dispense Refill   atorvastatin (LIPITOR) 20 MG tablet Take 20 mg by mouth daily.     Glucosamine 750 MG TABS Take 2 tablets by mouth daily.     ketoconazole (NIZORAL) 2 % shampoo Apply to scalp daily as needed let sit 3-5 minutes then rinse. 120 mL 10   LACTOBACILLUS BIFIDUS PO Take by mouth.     lisinopril (ZESTRIL) 5 MG tablet TAKE 1 TABLET  BY MOUTH EVERY DAY 90 tablet 4   Multiple Vitamin (MULTIVITAMIN) tablet Take 1 tablet by mouth daily.     NON FORMULARY Lappaconitine hydrobromide     Omega-3 Fatty Acids (FISH OIL PO) Take by mouth.     rivaroxaban (XARELTO) 20 MG TABS tablet Take 20 mg by mouth daily with supper.     sotalol (BETAPACE) 80 MG tablet Take 80 mg by mouth 2 (two) times daily. PT IS TAKING HALF TAB MAKING 40MG  BID     No current facility-administered medications for this visit.     Review of Systems    He denies chest pain, palpitations, dyspnea, pnd, orthopnea, n, v, dizziness, syncope, edema,  weight gain, or early satiety. All other systems reviewed and are otherwise negative except as noted above.   Physical Exam    VS:  BP 102/60 (BP Location: Left Arm, Patient Position: Sitting, Cuff Size: Normal)   Pulse (!) 57   Ht 5\' 7"  (1.702 m)   Wt 159 lb (72.1 kg)   SpO2 92%   BMI 24.90 kg/m   GEN: Well nourished, well developed, in no acute distress. HEENT: normal. Neck: Supple, no JVD, carotid bruits, or masses. Cardiac: RRR, no murmurs, rubs, or gallops. No clubbing, cyanosis, edema.  Radials/DP/PT 2+ and equal bilaterally.  Respiratory:  Respirations regular and unlabored, clear to auscultation bilaterally. GI: Soft, nontender, nondistended, BS + x 4. MS: no deformity or atrophy. Skin: warm and dry, no rash. Neuro:  Strength and sensation are intact. Psych: Normal affect.  Accessory Clinical Findings    ECG personally reviewed by me today -sinus bradycardia, 57 bpm- no acute changes.   Lab Results  Component Value Date   WBC 5.0 08/20/2022   HGB 14.6 08/20/2022   HCT 43.3 08/20/2022   MCV 91.6 08/20/2022   PLT 214.0 08/20/2022   Lab Results  Component Value Date   CREATININE 1.06 08/20/2022   BUN 21 08/20/2022   NA 139 08/20/2022   K 5.3 No hemolysis seen (H) 08/20/2022   CL 105 08/20/2022   CO2 29 08/20/2022   Lab Results  Component Value Date   ALT 24 08/20/2022   AST 23 08/20/2022   ALKPHOS 53 08/20/2022   BILITOT 0.6 08/20/2022   Lab Results  Component Value Date   CHOL 157 08/20/2022   HDL 59.10 08/20/2022   LDLCALC 86 08/20/2022   TRIG 56.0 08/20/2022   CHOLHDL 3 08/20/2022    No results found for: "HGBA1C"  Assessment & Plan   1. Paroxysmal atrial fibrillation: Maintaining sinus rhythm.  Denies any recent palpitations.  He was started on sotalol per cardiologist in [place].  Given PAF, VT, now on sotalol, will refer to EP for evaluation. Continue sotalol, Xarelto.  He will have labs updated with his PCP this week.  2. Ventricular  tachycardia: Recent cardiac monitor in [place] reportedly showed atrial fibrillation, ventricular tachycardia.  On sotalol as above. EP evaluation pending.   3. Hypertension: BP well controlled. Continue current antihypertensive regimen.   4. Disposition: Follow-up in 6-7 months with EP and Dr. Debara Pickett.      Lenna Sciara, NP 02/03/2023, 1:23 PM

## 2023-02-03 NOTE — Patient Instructions (Addendum)
Medication Instructions:  Your physician recommends that you continue on your current medications as directed. Please refer to the Current Medication list given to you today.   *If you need a refill on your cardiac medications before your next appointment, please call your pharmacy*   Lab Work: None ordered   Testing/Procedures: None ordered   Follow-Up: At [facility], you and your health needs are our priority.  As part of our continuing mission to provide you with exceptional heart care, we have created designated Provider Care Teams.  These Care Teams include your primary Cardiologist (physician) and Advanced Practice Providers (APPs -  Physician Assistants and Nurse Practitioners) who all work together to provide you with the care you need, when you need it.  We recommend signing up for the patient portal called "MyChart".  Sign up information is provided on this After Visit Summary.  MyChart is used to connect with patients for Virtual Visits (Telemedicine).  Patients are able to view lab/test results, encounter notes, upcoming appointments, etc.  Non-urgent messages can be sent to your provider as well.   To learn more about what you can do with MyChart, go to [email or web address].    Your next appointment:   6-7 month(s)  Provider:   Pixie Casino, MD

## 2023-02-04 ENCOUNTER — Other Ambulatory Visit: Payer: Self-pay | Admitting: Family Medicine

## 2023-02-04 DIAGNOSIS — N5201 Erectile dysfunction due to arterial insufficiency: Secondary | ICD-10-CM | POA: Diagnosis not present

## 2023-02-04 DIAGNOSIS — H5213 Myopia, bilateral: Secondary | ICD-10-CM | POA: Diagnosis not present

## 2023-02-04 DIAGNOSIS — Z0100 Encounter for examination of eyes and vision without abnormal findings: Secondary | ICD-10-CM | POA: Diagnosis not present

## 2023-02-04 DIAGNOSIS — N401 Enlarged prostate with lower urinary tract symptoms: Secondary | ICD-10-CM | POA: Diagnosis not present

## 2023-02-04 DIAGNOSIS — R3915 Urgency of urination: Secondary | ICD-10-CM | POA: Diagnosis not present

## 2023-02-18 ENCOUNTER — Ambulatory Visit (INDEPENDENT_AMBULATORY_CARE_PROVIDER_SITE_OTHER): Payer: Medicare HMO | Admitting: Family Medicine

## 2023-02-18 ENCOUNTER — Encounter: Payer: Self-pay | Admitting: Family Medicine

## 2023-02-18 VITALS — BP 108/70 | HR 53 | Temp 98.0°F | Ht 67.0 in | Wt 156.6 lb

## 2023-02-18 DIAGNOSIS — Z23 Encounter for immunization: Secondary | ICD-10-CM

## 2023-02-18 DIAGNOSIS — I10 Essential (primary) hypertension: Secondary | ICD-10-CM

## 2023-02-18 DIAGNOSIS — I700 Atherosclerosis of aorta: Secondary | ICD-10-CM

## 2023-02-18 DIAGNOSIS — E782 Mixed hyperlipidemia: Secondary | ICD-10-CM | POA: Diagnosis not present

## 2023-02-18 LAB — COMPREHENSIVE METABOLIC PANEL
ALT: 28 U/L (ref 0–53)
AST: 28 U/L (ref 0–37)
Albumin: 4 g/dL (ref 3.5–5.2)
Alkaline Phosphatase: 44 U/L (ref 39–117)
BUN: 14 mg/dL (ref 6–23)
CO2: 23 mEq/L (ref 19–32)
Calcium: 9 mg/dL (ref 8.4–10.5)
Chloride: 105 mEq/L (ref 96–112)
Creatinine, Ser: 0.84 mg/dL (ref 0.40–1.50)
GFR: 90.29 mL/min (ref >60.00)
Glucose, Bld: 101 mg/dL — ABNORMAL HIGH (ref 70–99)
Potassium: 4.2 mEq/L (ref 3.5–5.1)
Sodium: 138 mEq/L (ref 135–145)
Total Bilirubin: 0.4 mg/dL (ref 0.2–1.2)
Total Protein: 6.2 g/dL (ref 6.0–8.3)

## 2023-02-18 LAB — LIPID PANEL
Cholesterol: 159 mg/dL (ref 0–200)
HDL: 54.6 mg/dL (ref >39.00)
LDL Cholesterol: 85 mg/dL (ref 0–99)
NonHDL: 104.84
Total CHOL/HDL Ratio: 3
Triglycerides: 98 mg/dL (ref 0.0–149.0)
VLDL: 19.6 mg/dL (ref 0.0–40.0)

## 2023-02-18 LAB — CBC WITH DIFFERENTIAL/PLATELET
Basophils Absolute: 0 10*3/uL (ref 0.0–0.1)
Basophils Relative: 0.5 % (ref 0.0–3.0)
Eosinophils Absolute: 0.1 10*3/uL (ref 0.0–0.7)
Eosinophils Relative: 3.2 % (ref 0.0–5.0)
HCT: 41.7 % (ref 39.0–52.0)
Hemoglobin: 14.3 g/dL (ref 13.0–17.0)
Lymphocytes Relative: 36.4 % (ref 12.0–46.0)
Lymphs Abs: 1.6 10*3/uL (ref 0.7–4.0)
MCHC: 34.2 g/dL (ref 30.0–36.0)
MCV: 91 fl (ref 78.0–100.0)
Monocytes Absolute: 0.5 10*3/uL (ref 0.1–1.0)
Monocytes Relative: 11.1 % (ref 3.0–12.0)
Neutro Abs: 2.1 10*3/uL (ref 1.4–7.7)
Neutrophils Relative %: 48.8 % (ref 43.0–77.0)
Platelets: 208 10*3/uL (ref 150.0–400.0)
RBC: 4.58 Mil/uL (ref 4.22–5.81)
RDW: 12.7 % (ref 11.5–15.5)
WBC: 4.4 10*3/uL (ref 4.0–10.5)

## 2023-02-18 LAB — TSH: TSH: 3.79 u[IU]/mL (ref 0.35–5.50)

## 2023-02-18 NOTE — Progress Notes (Signed)
Phone [phone] In person visit   Subjective:   Louis Vaughn is a 67 y.o. year old very pleasant male patient who presents for/with See problem oriented charting Chief Complaint  Patient presents with   Medical Management of Chronic Issues   Hypertension   pneumonia vaccine    Pt had prevnar 13 in 2022, its showing he needs another pneumonia vaccine in [place]   Past Medical History-  Patient Active Problem List   Diagnosis Date Noted   Aortic atherosclerosis 01/08/2021    Priority: Medium    Elevated PSA, less than 10 ng/ml 08/11/2016    Priority: Medium    Hyperlipidemia 11/14/2015    Priority: Medium    Essential hypertension, benign 09/11/2014    Priority: Medium    Sessile colonic polyp 11/30/2019    Priority: Low   Nasal polyps 11/22/2015    Priority: Low   Osteoarthritis of left knee 01/16/2015    Priority: Low    Medications- reviewed and updated Current Outpatient Medications  Medication Sig Dispense Refill   atorvastatin (LIPITOR) 20 MG tablet Take 20 mg by mouth daily.     Glucosamine 750 MG TABS Take 2 tablets by mouth daily.     ketoconazole (NIZORAL) 2 % shampoo Apply to scalp daily as needed let sit 3-5 minutes then rinse. 120 mL 10   LACTOBACILLUS BIFIDUS PO Take by mouth.     lisinopril (ZESTRIL) 5 MG tablet TAKE 1 TABLET BY MOUTH EVERY DAY 90 tablet 4   Multiple Vitamin (MULTIVITAMIN) tablet Take 1 tablet by mouth daily.     NON FORMULARY Lappaconitine hydrobromide     Omega-3 Fatty Acids (FISH OIL PO) Take by mouth.     rivaroxaban (XARELTO) 20 MG TABS tablet Take 20 mg by mouth daily with supper.     sotalol (BETAPACE) 80 MG tablet Take 80 mg by mouth 2 (two) times daily. PT IS TAKING HALF TAB MAKING  BID     No current facility-administered medications for this visit.     Objective:  BP 108/70   Pulse (!) 53   Temp 98 F (36.7 C)   Ht  (1.702 m)   Wt 156 lb 9.6 oz (71 kg)   SpO2 98%   BMI 24.53 kg/m  Gen: NAD, resting  comfortably CV: bradycardic but regular no murmurs rubs or gallops Lungs: CTAB no crackles, wheeze, rhonchi Ext: no edema Skin: warm, dry     Assessment and Plan   #Health maintenance- prevnar 20 today   Immunization History  Administered Date(s) Administered   Fluad Quad(high Dose 65+) 08/02/2020   Influenza,inj,Quad PF,6+ Mos 08/16/2019, 07/23/2022   PFIZER(Purple Top)SARS-COV-2 Vaccination 12/30/2019, 01/20/2020, 10/07/2020, 09/12/2021   Pneumococcal Conjugate-13 03/20/2021   Td 06/14/2009   Tdap 08/16/2019   #elevated PSA- reports back down to 1.95 at urology recently Lab Results  Component Value Date   PSA 2.64 08/20/2022   PSA 1.92 02/10/2022   PSA 3.1 06/26/2020   # Atrial fibrillation- seeing Dr. Rennis Golden and will see EP S: Rate controlled with sotalol 40 mg twice daily  Anticoagulated with xarelto 20 mg A/P: appropriately anticoagulated and rate controlled- continue current medicine. Will see EP physicians  -update labs as below including TSH  #hypertension S: medication: Lisinopril 5 mg twice daily recently- may go back to once a day, sotalol 40 mg twice daily  Home readings #s: occasionally got over 140 and took extra lisinopril BP Readings from Last 3 Encounters:  02/18/23 108/70  02/03/23  102/60  08/20/22 124/68  A/P: stable- continue current medicines   #hyperlipidemia- CT calcium scoring of 0 in march 2022 #aortic atherosclerosis  S: Medication:Atorvastatin 20 mg and fish oil Lab Results  Component Value Date   CHOL 157 08/20/2022   HDL 59.10 08/20/2022   LDLCALC 86 08/20/2022   TRIG 56.0 08/20/2022   CHOLHDL 3 08/20/2022   A/P: resonable control but prefer LDL under 70 with aortic atherosclerosis - continue current medications  for now and update lipid panel   Recommended follow up: Return in about 7 months (around 09/20/2023) for physical or sooner if needed.Schedule b4 you leave.  Lab/Order associations   ICD-10-CM   1. Essential hypertension,  benign  I10     2. Mixed hyperlipidemia  E78.2 CBC with Differential/Platelet    Comprehensive metabolic panel    Lipid panel    TSH    3. Aortic atherosclerosis Chronic I70.0       No orders of the defined types were placed in this encounter.   Return precautions advised.  Tana Conch, MD

## 2023-02-18 NOTE — Patient Instructions (Addendum)
Prevnar 20 today, consider shingrix in future at pharmacy  .Please stop by lab before you go If you have mychart- we will send your results within 3 business days of [place] receiving them.  If you do not have mychart- we will call you about results within 5 business days of [place] receiving them.  *please also note that you will see labs on mychart as soon as they post. I will later go in and write notes on them- will say "notes from Dr. Durene Cal"

## 2023-02-18 NOTE — Addendum Note (Signed)
Addended by: Gwenette Greet on: 02/18/2023 10:17 AM   Modules accepted: Orders

## 2023-03-05 ENCOUNTER — Ambulatory Visit: Payer: Medicare HMO | Admitting: Internal Medicine

## 2023-03-11 ENCOUNTER — Telehealth: Payer: Self-pay | Admitting: Family Medicine

## 2023-03-11 NOTE — Telephone Encounter (Signed)
Copied from CRM [id number]. Topic: Medicare AWV >> Mar 11, 2023  9:11 AM Gwenith Spitz wrote: Reason for CRM: Called patient to schedule Medicare Annual Wellness Visit (AWV). Left message for patient to call back and schedule Medicare Annual Wellness Visit (AWV).  Last date of AWV: N/A   Please schedule AWVI with Inetta Fermo, [facility].  If any questions, please contact me at [phone].  Thank you ,  Gabriel Cirri [facility] AWV TEAM Direct Dial [phone]

## 2023-08-05 ENCOUNTER — Ambulatory Visit: Payer: Medicare HMO | Attending: Internal Medicine | Admitting: Internal Medicine

## 2023-08-05 ENCOUNTER — Encounter: Payer: Self-pay | Admitting: Internal Medicine

## 2023-08-05 VITALS — BP 118/78 | HR 63 | Ht 67.0 in | Wt 153.0 lb

## 2023-08-05 DIAGNOSIS — H43392 Other vitreous opacities, left eye: Secondary | ICD-10-CM | POA: Diagnosis not present

## 2023-08-05 DIAGNOSIS — I10 Essential (primary) hypertension: Secondary | ICD-10-CM

## 2023-08-05 DIAGNOSIS — Z7901 Long term (current) use of anticoagulants: Secondary | ICD-10-CM

## 2023-08-05 DIAGNOSIS — I480 Paroxysmal atrial fibrillation: Secondary | ICD-10-CM

## 2023-08-05 NOTE — Patient Instructions (Signed)
Medication Instructions:  NO CHANGES  *If you need a refill on your cardiac medications before your next appointment, please call your pharmacy*   Follow-Up: At [facility], you and your health needs are our priority.  As part of our continuing mission to provide you with exceptional heart care, we have created designated Provider Care Teams.  These Care Teams include your primary Cardiologist (physician) and Advanced Practice Providers (APPs -  Physician Assistants and Nurse Practitioners) who all work together to provide you with the care you need, when you need it.  We recommend signing up for the patient portal called "MyChart".  Sign up information is provided on this After Visit Summary.  MyChart is used to connect with patients for Virtual Visits (Telemedicine).  Patients are able to view lab/test results, encounter notes, upcoming appointments, etc.  Non-urgent messages can be sent to your provider as well.   To learn more about what you can do with MyChart, go to [email or web address].    Your next appointment:    12 months with Dr. Hilty  

## 2023-08-05 NOTE — Progress Notes (Signed)
OFFICE CONSULT NOTE  Chief Complaint:  Follow-up afib  Primary Care Physician: Shelva Majestic, MD  HPI:  Louis Vaughn is a 67 y.o. male who is being seen today for the evaluation of arrhythmia at the request of Shelva Majestic, MD. this is a pleasant 67 year old [place] male who had establish care with Dr. Durene Cal.  He works at the [employer].  He is frequently returning to [place], it sounds like to take care of his mother's estate as she may have recently passed.  While he was in [place], he had an episode of tachycardia and palpitations.  He was seen in the hospital and said that his heart rate was over 150.  He was given medicine through the IV and ultimately was able to get back to normal rhythm.  It sounds like he was in atrial fibrillation.  He had been seen by cardiologist once or twice and might have had an echocardiogram.  They initially had placed him on a medication called Lappaconitine hydrobromium 25 mg TID and then added metoprolol succinate 25 mg daily.  A literature search shows that Lappaconitine is a sodium channel blocker but could be used as an anesthetic but probably has some antiarrhythmic properties.  It could be considered similar to quinidine.  It would be an older medication with little evidence of efficacy.  There was a review in 2016 translated from [place] that I could find suggesting that there were less than 400 patients in the literature on the medication and the any studies showing benefit were under powered and not well performed.  In addition, the medication did not make the European Society of cardiology A-fib treatment guidelines.  Since being back in [place], he has had several breakthrough episodes of palpitations but not as significant.  He was also placed on Xarelto anticoagulation, initially at 15 mg daily then 20 mg daily.  08/05/2023  Louis Vaughn returns today for follow-up.  Since I last saw him he was seen by Bernadene Person, DNP.  He has been back-and-forth to [place] and has had episodes of recurrent A-fib there.  He was started on sotalol 80 mg twice a day but he notes that he had issues with bradycardia.  Subsequently they changed him there over to ethacizine 50 mg BID -this is apparently a class C antiarrhythmic related to moracizine and it is used more exclusively in [place].  Since being on this medicine he reports good control of his A-fib without issues of bradycardia.  He monitors his heart rate with Fitbit.  He has also been scheduled to see Dr. Lalla Brothers for EP evaluation which is actually on Friday.  PMHx:  Past Medical History:  Diagnosis Date   Atypical mole 03/20/2006   Right Lower Abdomen (slight to moderate)   Atypical mole 10/16/2005   Left Low Outer Back (moderate)   Atypical mole 10/16/2005   Low Mid Back (slight)   Hypertension    Left elbow tendonitis    Osteoarthritis of left knee    knees    Past Surgical History:  Procedure Laterality Date   COLONOSCOPY     none     POLYPECTOMY     WISDOM TOOTH EXTRACTION      FAMHx:  Family History  Problem Relation Age of Onset   Hypertension Mother    Kidney disease Mother        died 45- had worsening function   CVA Father  86   Sleep apnea Brother    Diabetes Brother    Colon cancer Neg Hx    Esophageal cancer Neg Hx    Rectal cancer Neg Hx    Stomach cancer Neg Hx    Colon polyps Neg Hx     SOCHx:   reports that he has never smoked. He has never used smokeless tobacco. He reports that he does not currently use alcohol. He reports that he does not use drugs.  ALLERGIES:  No Known Allergies  ROS: Pertinent items noted in HPI and remainder of comprehensive ROS otherwise negative.  HOME MEDS: Current Outpatient Medications on File Prior to Visit  Medication Sig Dispense Refill   Glucosamine 750 MG TABS Take 2 tablets by mouth daily.     lisinopril (ZESTRIL) 5 MG tablet TAKE 1 TABLET BY MOUTH EVERY DAY 90 tablet 4    Multiple Vitamin (MULTIVITAMIN) tablet Take 1 tablet by mouth daily.     Omega-3 Fatty Acids (FISH OIL PO) Take by mouth.     rivaroxaban (XARELTO) 20 MG TABS tablet Take 20 mg by mouth daily with supper.     rosuvastatin (CRESTOR) 20 MG tablet Take 20 mg by mouth daily.     atorvastatin (LIPITOR) 20 MG tablet Take 20 mg by mouth daily. (Patient not taking: Reported on 08/05/2023)     ketoconazole (NIZORAL) 2 % shampoo Apply to scalp daily as needed let sit 3-5 minutes then rinse. (Patient not taking: Reported on 08/05/2023) 120 mL 10   LACTOBACILLUS BIFIDUS PO Take by mouth. (Patient not taking: Reported on 08/05/2023)     NON FORMULARY Lappaconitine hydrobromide (Patient not taking: Reported on 08/05/2023)     sotalol (BETAPACE) 80 MG tablet Take 80 mg by mouth 2 (two) times daily. PT IS TAKING HALF TAB MAKING 40MG  BID (Patient not taking: Reported on 08/05/2023)     No current facility-administered medications on file prior to visit.    LABS/IMAGING: No results found for this or any previous visit (from the past 48 hour(s)).  No results found.  LIPID PANEL:    Component Value Date/Time   CHOL 159 02/18/2023 1018   TRIG 98.0 02/18/2023 1018   HDL 54.60 02/18/2023 1018   CHOLHDL 3 02/18/2023 1018   VLDL 19.6 02/18/2023 1018   LDLCALC 85 02/18/2023 1018   LDLCALC 134 (H) 06/26/2020 1041    WEIGHTS: Wt Readings from Last 3 Encounters:  08/05/23 153 lb (69.4 kg)  02/18/23 156 lb 9.6 oz (71 kg)  02/03/23 159 lb (72.1 kg)    VITALS: BP 118/78 (BP Location: Left Arm, Patient Position: Sitting, Cuff Size: Normal)   Pulse 63   Ht 5\' 7"  (1.702 m)   Wt 153 lb (69.4 kg)   SpO2 96%   BMI 23.96 kg/m   EXAM: General appearance: alert and no distress Neck: no carotid bruit, no JVD, and thyroid not enlarged, symmetric, no tenderness/mass/nodules Lungs: clear to auscultation bilaterally Heart: regular rate and rhythm, S1, S2 normal, no murmur, click, rub or gallop Abdomen: soft,  non-tender; bowel sounds normal; no masses,  no organomegaly Extremities: extremities normal, atraumatic, no cyanosis or edema Pulses: 2+ and symmetric Skin: Skin color, texture, turgor normal. No rashes or lesions Neurologic: Grossly normal Psych: Pleasant  EKG: EKG Interpretation Date/Time:  Wednesday August 05 2023 09:15:01 EDT Ventricular Rate:  63 PR Interval:  170 QRS Duration:  98 QT Interval:  404 QTC Calculation: 413 R Axis:   73  Text Interpretation: Normal sinus  rhythm Anterior infarct , age undetermined No previous ECGs available Confirmed by Zoila Shutter [phone]) on 08/05/2023 9:40:08 AM    ASSESSMENT: Paroxysmal atrial fibrillation, CHADSVASC score of 2 Maintained on ethacizine (class Ic antiarrhythmic used in [place]) Anticoagulated on Xarelto  PLAN: 1.   Mr. Varelas says that he has done better on ethacizine than he did on sotalol which caused issues with bradycardia.  He reports no recent issues with atrial fibrillation.  He is scheduled to see my partner Dr. Lalla Brothers for EP evaluation as he may be a possible ablation candidate however since he is doing well I suspect he may continue with this current medication as prescribed by his cardiologist in [place].  Will plan follow-up for general cardiology with me annually or sooner as necessary.  Chrystie Nose, MD, [facility], FACP  [place]  [facility]  Medical Director of the Advanced Lipid Disorders &  Cardiovascular Risk Reduction Clinic Diplomate of the American Board of Clinical Lipidology Attending Cardiologist  Direct Dial: [phone]  Fax: [phone]  Website:  [email or web address] Helio Lack 08/05/2023, 9:40 AM

## 2023-08-06 NOTE — Progress Notes (Signed)
Electrophysiology Office Note:    Date:  08/06/2023   ID:  Louis Vaughn, DOB [birth year], MRN [id number]  [facility] Cardiologist:  Chrystie Nose, MD  [facility] Electrophysiologist:  Lanier Prude, MD   Referring MD: Joylene Grapes, NP   Chief Complaint: Antiarrhythmic use  History of Present Illness:    Louis Vaughn is a 67 y.o. malewho I am seeing today for an evaluation of antiarrhythmic drugs at the request of Louis Person, NP.  The patient was last seen by Louis Vaughn on August 05, 2023.  The patient has a medical history that includes atrial fibrillation.  He is on Xarelto for stroke prophylaxis.  His atrial fibrillation was diagnosed in [place].  He was started on a [place] medication first.  This was changed to a different atrial fibrillation medication exclusively used in [place] (MORACIZINE).  He is referred to discuss treatment strategies for his arrhythmia.  Today he tells me he has been doing well with his A-fib since June when he was started on a new medication by a [place] physician Therapist, music).  He is taking Xarelto without missed doses for stroke prophylaxis.        Their past medical, social and family history was reveiwed.   ROS:   Please see the history of present illness.    All other systems reviewed and are negative.  EKGs/Labs/Other Studies Reviewed:    The following studies were reviewed today:  January 08, 2021 CTA coronary-calcium score 0  August 05, 2023 EKG shows sinus rhythm        Physical Exam:    VS:  There were no vitals taken for this visit.    Wt Readings from Last 3 Encounters:  08/05/23 153 lb (69.4 kg)  02/18/23 156 lb 9.6 oz (71 kg)  02/03/23 159 lb (72.1 kg)     GEN:  Well nourished, well developed in no acute distress CARDIAC: RRR, no murmurs, rubs, gallops RESPIRATORY:  Clear to auscultation without rales, wheezing or rhonchi       ASSESSMENT AND PLAN:    1. PAF (paroxysmal atrial  fibrillation) (HCC)     #Atrial fibrillation Symptomatic.  Has previously received care in [place].  Now on Xarelto for stroke prophylaxis.  I discussed treatment options today including antiarrhythmic drugs and catheter ablation for his arrhythmia.  Discussed treatment options today for AF including antiarrhythmic drug therapy and ablation. Discussed risks, recovery and likelihood of success with each treatment strategy. Risk, benefits, and alternatives to EP study and ablation for afib were discussed. These risks include but are not limited to stroke, bleeding, vascular damage, tamponade, perforation, damage to the esophagus, lungs, phrenic nerve and other structures, pulmonary vein stenosis, worsening renal function, coronary vasospasm and death.  Discussed potential need for repeat ablation procedures and antiarrhythmic drugs after an initial ablation. The patient understands these risks.  He would like some time to think about his options which is very reasonable.  If he elects to proceed, Carto, ICE, anesthesia are requested for the procedure.  Will also obtain CT PV protocol prior to the procedure to exclude LAA thrombus and further evaluate atrial anatomy.  For now, continue Xarelto.  I did discuss the limitations with his current antiarrhythmic therapy prescribed by the [place] physician.  I discussed how I will not monitor/adjust this medication given the lack of available data in [place] for this medication.  I did offer to prescribe him an alternative antiarrhythmic drug but he declined.  He understands that there are risks associated with using this medication but would like to continue for now.  Follow-up with EP on an as-needed basis or should he decide to proceed with catheter ablation.       Signed, Rossie Muskrat. Lalla Brothers, MD, [facility], [facility] 08/06/2023 1:13 PM    Electrophysiology [facility]

## 2023-08-07 ENCOUNTER — Encounter: Payer: Self-pay | Admitting: Cardiology

## 2023-08-07 ENCOUNTER — Ambulatory Visit: Payer: Medicare HMO | Attending: Cardiology | Admitting: Cardiology

## 2023-08-07 VITALS — BP 132/74 | HR 73 | Ht 67.0 in | Wt 151.0 lb

## 2023-08-07 DIAGNOSIS — I480 Paroxysmal atrial fibrillation: Secondary | ICD-10-CM | POA: Diagnosis not present

## 2023-08-07 NOTE — Patient Instructions (Signed)
Medication Instructions:  Your physician recommends that you continue on your current medications as directed. Please refer to the Current Medication list given to you today.  *If you need a refill on your cardiac medications before your next appointment, please call your pharmacy*   Follow-Up: At [facility], you and your health needs are our priority.  As part of our continuing mission to provide you with exceptional heart care, we have created designated Provider Care Teams.  These Care Teams include your primary Cardiologist (physician) and Advanced Practice Providers (APPs -  Physician Assistants and Nurse Practitioners) who all work together to provide you with the care you need, when you need it.  Your next appointment:   Call if you decide to schedule an ablation

## 2023-09-04 ENCOUNTER — Ambulatory Visit (INDEPENDENT_AMBULATORY_CARE_PROVIDER_SITE_OTHER): Payer: Medicare HMO | Admitting: Family Medicine

## 2023-09-04 ENCOUNTER — Encounter: Payer: Self-pay | Admitting: Family Medicine

## 2023-09-04 VITALS — BP 114/72 | HR 68 | Temp 98.2°F | Ht 67.0 in | Wt 153.6 lb

## 2023-09-04 DIAGNOSIS — E782 Mixed hyperlipidemia: Secondary | ICD-10-CM | POA: Diagnosis not present

## 2023-09-04 DIAGNOSIS — Z125 Encounter for screening for malignant neoplasm of prostate: Secondary | ICD-10-CM | POA: Diagnosis not present

## 2023-09-04 DIAGNOSIS — Z0000 Encounter for general adult medical examination without abnormal findings: Secondary | ICD-10-CM

## 2023-09-04 DIAGNOSIS — I700 Atherosclerosis of aorta: Secondary | ICD-10-CM | POA: Diagnosis not present

## 2023-09-04 DIAGNOSIS — I10 Essential (primary) hypertension: Secondary | ICD-10-CM | POA: Diagnosis not present

## 2023-09-04 LAB — LIPID PANEL
Cholesterol: 159 mg/dL (ref 0–200)
HDL: 65.8 mg/dL (ref >39.00)
LDL Cholesterol: 84 mg/dL (ref 0–99)
NonHDL: 93.61
Total CHOL/HDL Ratio: 2
Triglycerides: 50 mg/dL (ref 0.0–149.0)
VLDL: 10 mg/dL (ref 0.0–40.0)

## 2023-09-04 LAB — CBC WITH DIFFERENTIAL/PLATELET
Basophils Absolute: 0 10*3/uL (ref 0.0–0.1)
Basophils Relative: 0.3 % (ref 0.0–3.0)
Eosinophils Absolute: 0 10*3/uL (ref 0.0–0.7)
Eosinophils Relative: 0.4 % (ref 0.0–5.0)
HCT: 44.6 % (ref 39.0–52.0)
Hemoglobin: 14.8 g/dL (ref 13.0–17.0)
Lymphocytes Relative: 25.9 % (ref 12.0–46.0)
Lymphs Abs: 1.4 10*3/uL (ref 0.7–4.0)
MCHC: 33.2 g/dL (ref 30.0–36.0)
MCV: 92.9 fL (ref 78.0–100.0)
Monocytes Absolute: 0.5 10*3/uL (ref 0.1–1.0)
Monocytes Relative: 9.1 % (ref 3.0–12.0)
Neutro Abs: 3.5 10*3/uL (ref 1.4–7.7)
Neutrophils Relative %: 64.3 % (ref 43.0–77.0)
Platelets: 223 10*3/uL (ref 150.0–400.0)
RBC: 4.8 Mil/uL (ref 4.22–5.81)
RDW: 12.5 % (ref 11.5–15.5)
WBC: 5.5 10*3/uL (ref 4.0–10.5)

## 2023-09-04 LAB — COMPREHENSIVE METABOLIC PANEL
ALT: 26 U/L (ref 0–53)
AST: 27 U/L (ref 0–37)
Albumin: 4.1 g/dL (ref 3.5–5.2)
Alkaline Phosphatase: 55 U/L (ref 39–117)
BUN: 14 mg/dL (ref 6–23)
CO2: 31 meq/L (ref 19–32)
Calcium: 9.4 mg/dL (ref 8.4–10.5)
Chloride: 103 meq/L (ref 96–112)
Creatinine, Ser: 0.91 mg/dL (ref 0.40–1.50)
GFR: 86.93 mL/min (ref >60.00)
Glucose, Bld: 99 mg/dL (ref 70–99)
Potassium: 5.2 meq/L — ABNORMAL HIGH (ref 3.5–5.1)
Sodium: 139 meq/L (ref 135–145)
Total Bilirubin: 0.6 mg/dL (ref 0.2–1.2)
Total Protein: 6.5 g/dL (ref 6.0–8.3)

## 2023-09-04 LAB — PSA, MEDICARE: PSA: 1.86 ng/mL (ref 0.10–4.00)

## 2023-09-04 NOTE — Progress Notes (Signed)
Phone: [phone]   Subjective:  Patient presents today for their annual physical. Chief complaint-noted.   See problem oriented charting- ROS- full  review of systems was completed and negative  Per full ROS sheet completed by patient  The following were reviewed and entered/updated in epic: Past Medical History:  Diagnosis Date   Atypical mole 03/20/2006   Right Lower Abdomen (slight to moderate)   Atypical mole 10/16/2005   Left Low Outer Back (moderate)   Atypical mole 10/16/2005   Low Mid Back (slight)   Hypertension    Left elbow tendonitis    Osteoarthritis of left knee    knees   Patient Active Problem List   Diagnosis Date Noted   Aortic atherosclerosis (HCC) 01/08/2021    Priority: Medium    Elevated PSA, less than 10 ng/ml 08/11/2016    Priority: Medium    Hyperlipidemia 11/14/2015    Priority: Medium    Essential hypertension, benign 09/11/2014    Priority: Medium    Sessile colonic polyp 11/30/2019    Priority: Low   Nasal polyps 11/22/2015    Priority: Low   Osteoarthritis of left knee 01/16/2015    Priority: Low   Past Surgical History:  Procedure Laterality Date   COLONOSCOPY     none     POLYPECTOMY     WISDOM TOOTH EXTRACTION      Family History  Problem Relation Age of Onset   Hypertension Mother    Kidney disease Mother        died 52- had worsening function   CVA Father        83   Sleep apnea Brother    Diabetes Brother    Colon cancer Neg Hx    Esophageal cancer Neg Hx    Rectal cancer Neg Hx    Stomach cancer Neg Hx    Colon polyps Neg Hx     Medications- reviewed and updated Current Outpatient Medications  Medication Sig Dispense Refill   Glucosamine 750 MG TABS Take 2 tablets by mouth daily.     lisinopril (ZESTRIL) 5 MG tablet Take 5 mg by mouth in the morning and at bedtime.     Multiple Vitamin (MULTIVITAMIN) tablet Take 1 tablet by mouth daily.     NON FORMULARY Take 50 mg by mouth in the morning and at bedtime.  Ethacizine     Omega-3 Fatty Acids (FISH OIL PO) Take by mouth.     rivaroxaban (XARELTO) 20 MG TABS tablet Take 20 mg by mouth daily with supper.     rosuvastatin (CRESTOR) 20 MG tablet Take 20 mg by mouth daily.     No current facility-administered medications for this visit.    Allergies-reviewed and updated No Known Allergies  Social History   Social History Narrative   Married (not sure if wife a patient here). 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 Oropharynx normal Neck: no thyromegaly CV: RRR (no obvious a fib) no murmurs rubs or  gallops Lungs: CTAB no crackles, wheeze, rhonchi Abdomen: soft/nontender/nondistended/normal bowel sounds. No rebound or guarding.  Ext: no edema Skin: warm, dry Neuro: grossly normal, moves all extremities, PERRLA    Assessment and Plan  67 y.o. male presenting for annual physical.  Health Maintenance counseling: 1. Anticipatory guidance: Patient counseled regarding regular dental exams -q6 months, eye exams - yearly,  avoiding smoking and second hand smoke, limiting alcohol to 2 beverages per day - none right now with a fib, no illicit drugs.   2. Risk factor reduction:  Advised patient of need for regular exercise and diet rich and fruits and vegetables to reduce risk of heart attack and stroke.  Exercise- down some - [employer] once a week- encouraged to try to increase. Gets a lot of steps still Diet/weight management-healthy weight and reasonable diet.  Wt Readings from Last 3 Encounters:  09/04/23 153 lb 9.6 oz (69.7 kg)  08/07/23  151 lb (68.5 kg)  08/05/23 153 lb (69.4 kg)  3. Immunizations/screenings/ancillary studies- consider shingrix at pharmacy Immunization History  Administered Date(s) Administered   Fluad Quad(high Dose 65+) 08/02/2020   Influenza,inj,Quad PF,6+ Mos 08/16/2019, 07/23/2022   PFIZER(Purple Top)SARS-COV-2 Vaccination 12/30/2019, 01/20/2020, 10/07/2020, 09/12/2021   PNEUMOCOCCAL CONJUGATE-20 02/18/2023   Pneumococcal Conjugate-13 03/20/2021   Td 06/14/2009   Tdap 08/16/2019  4. Prostate cancer screening-  regular follow up with urology yearly but we will trend PSA today- went back down with them.  Lab Results  Component Value Date   PSA 2.64 08/20/2022   PSA 1.92 02/10/2022   PSA 3.1 06/26/2020   5. Colon cancer screening - 11/25/19 with 5 year repeat planned 6. Skin cancer screening- prior with Dr. Jorja Loa - was seen in [place] in meanttime and told no issues. advised regular sunscreen use. Denies worrisome, changing, or new skin lesions.  7. Smoking associated screening (lung cancer screening, AAA screen 65-75, UA)- never smoker 8. STD screening - only active with wife   Status of chronic or acute concerns   # Atrial fibrillation- seeing Dr. Rennis Golden and will see EP S: Rate controlled with  antiarrhythmic from [place]  Anticoagulated with xarelto 20 mg -planned ablation with EP  with Dr. Lalla Brothers A/P: a fib currently being monitored- considering ablation. Continue xarelto- hard to weigh in on medication(s) from [place] as per cardiology note   #hypertension S: medication: Lisinopril 5 mg BP Readings from Last 3 Encounters:  09/04/23 114/72  08/07/23 132/74  08/05/23 118/78  A/P: stable- continue current medicines   #hyperlipidemia- CT calcium scoring of 0 in march 2022 #aortic atherosclerosis  S: Medication:rosuvastatin 20 mg and fish oil Lab Results  Component Value Date   CHOL 159 02/18/2023   HDL 54.60 02/18/2023   LDLCALC 85 02/18/2023   TRIG 98.0 02/18/2023   CHOLHDL 3  02/18/2023  A/P: lipids hopefully better on rosuvastatin- update panel today  Recommended follow up: Return in about 1 year (around 09/03/2024) for physical or sooner if needed.Schedule b4 you leave. He wants to stretch visit out as may be in [place] at 6 months  Lab/Order associations: fasting   ICD-10-CM   1. Preventative health care  Z00.00     2. Mixed hyperlipidemia  E78.2 Comprehensive metabolic panel    CBC with Differential/Platelet    Lipid panel    3. Essential hypertension, benign  I10     4. Aortic atherosclerosis (HCC)  I70.0     5. Screening for prostate cancer  Z12.5 PSA, Medicare      No  orders of the defined types were placed in this encounter.   Return precautions advised.  Tana Conch, MD

## 2023-09-04 NOTE — Patient Instructions (Addendum)
Please check with your pharmacy to see if they have the shingrix vaccine. If they do- please get this immunization and update [place] by phone call or mychart with dates you receive the vaccine  Please stop by lab before you go If you have mychart- we will send your results within 3 business days of [place] receiving them.  If you do not have mychart- we will call you about results within 5 business days of [place] receiving them.  *please also note that you will see labs on mychart as soon as they post. I will later go in and write notes on them- will say "notes from Dr. Durene Cal"   Recommended follow up: Return in about 1 year (around 09/03/2024) for physical or sooner if needed.Schedule b4 you leave.

## 2023-09-07 ENCOUNTER — Other Ambulatory Visit: Payer: Self-pay

## 2023-09-07 DIAGNOSIS — E875 Hyperkalemia: Secondary | ICD-10-CM

## 2024-07-13 ENCOUNTER — Encounter: Payer: Self-pay | Admitting: Internal Medicine

## 2024-09-22 DIAGNOSIS — H5203 Hypermetropia, bilateral: Secondary | ICD-10-CM | POA: Diagnosis not present

## 2024-09-22 DIAGNOSIS — Z0100 Encounter for examination of eyes and vision without abnormal findings: Secondary | ICD-10-CM | POA: Diagnosis not present

## 2024-09-24 ENCOUNTER — Other Ambulatory Visit: Payer: Self-pay | Admitting: Family Medicine

## 2024-10-03 ENCOUNTER — Ambulatory Visit: Admitting: Family Medicine

## 2024-10-03 VITALS — BP 120/78 | HR 60 | Temp 97.7°F | Ht 67.0 in | Wt 155.4 lb

## 2024-10-03 DIAGNOSIS — Z0000 Encounter for general adult medical examination without abnormal findings: Secondary | ICD-10-CM

## 2024-10-03 DIAGNOSIS — Z125 Encounter for screening for malignant neoplasm of prostate: Secondary | ICD-10-CM

## 2024-10-03 DIAGNOSIS — Z1211 Encounter for screening for malignant neoplasm of colon: Secondary | ICD-10-CM

## 2024-10-03 DIAGNOSIS — E782 Mixed hyperlipidemia: Secondary | ICD-10-CM | POA: Diagnosis not present

## 2024-10-03 DIAGNOSIS — I10 Essential (primary) hypertension: Secondary | ICD-10-CM | POA: Diagnosis not present

## 2024-10-03 DIAGNOSIS — I480 Paroxysmal atrial fibrillation: Secondary | ICD-10-CM | POA: Diagnosis not present

## 2024-10-03 DIAGNOSIS — Z1283 Encounter for screening for malignant neoplasm of skin: Secondary | ICD-10-CM

## 2024-10-03 MED ORDER — LISINOPRIL 5 MG PO TABS: 5.0000 mg | ORAL_TABLET | Freq: Two times a day (BID) | ORAL | 3 refills | Status: AC

## 2024-10-03 NOTE — Patient Instructions (Addendum)
 Health Maintenance Due  Topic Date Due   Colonoscopy  11/24/2024  [facility] contact in the new year Please call to schedule visit and/or procedure IF you do not hear within a week Address: [address] [place], [place], [place] [address] Phone: [phone]   We have placed a referral for you today to Dr. Livingston with derm- please call their # if you do not hear within a week (may be listed below or you may see mychart message within a few days with #).   Consider shingrix at pharmacy  Could try debrox for ear wax  Please stop by lab before you go If you have mychart- we will send your results within 3 business days of us  receiving them.  If you do not have mychart- we will call you about results within 5 business days of us  receiving them.  *please also note that you will see labs on mychart as soon as they post. I will later go in and write notes on them- will say notes from Dr. Katrinka   Recommended follow up: Return in about 1 year (around 10/03/2025) for physical or sooner if needed.Schedule b4 you leave.

## 2024-10-03 NOTE — Progress Notes (Signed)
 Phone: [phone]   Subjective:  Patient presents today for their annual physical. Chief complaint-noted.   See problem oriented charting- ROS- full  review of systems was completed and negative  except for topics noted under acute/chronic concerns  The following were reviewed and entered/updated in epic: Past Medical History:  Diagnosis Date   Atypical mole 03/20/2006   Right Lower Abdomen (slight to moderate)   Atypical mole 10/16/2005   Left Low Outer Back (moderate)   Atypical mole 10/16/2005   Low Mid Back (slight)   Hypertension    Left elbow tendonitis    Osteoarthritis of left knee    knees   Patient Active Problem List   Diagnosis Date Noted   Aortic atherosclerosis 01/08/2021    Priority: Medium    Elevated PSA, less than 10 ng/ml 08/11/2016    Priority: Medium    Hyperlipidemia 11/14/2015    Priority: Medium    Essential hypertension, benign 09/11/2014    Priority: Medium    Sessile colonic polyp 11/30/2019    Priority: Low   Nasal polyps 11/22/2015    Priority: Low   Osteoarthritis of left knee 01/16/2015    Priority: Low   PAF (paroxysmal atrial fibrillation) (HCC) 10/03/2024   Past Surgical History:  Procedure Laterality Date   COLONOSCOPY     none     POLYPECTOMY     WISDOM TOOTH EXTRACTION      Family History  Problem Relation Age of Onset   Hypertension Mother    Kidney disease Mother        died 2- had worsening function   CVA Father        77   Sleep apnea Brother    Diabetes Brother    Colon cancer Neg Hx    Esophageal cancer Neg Hx    Rectal cancer Neg Hx    Stomach cancer Neg Hx    Colon polyps Neg Hx     Medications- reviewed and updated Current Outpatient Medications  Medication Sig Dispense Refill   apixaban (ELIQUIS) 5 MG TABS tablet Take 5 mg by mouth 2 (two) times daily.     Glucosamine 750 MG TABS Take 2 tablets by mouth daily.     Multiple Vitamin (MULTIVITAMIN) tablet Take 1 tablet by mouth daily.     NON  FORMULARY Take 50 mg by mouth in the morning and at bedtime. Ethacizine     Omega-3 Fatty Acids (FISH OIL PO) Take by mouth.     rosuvastatin (CRESTOR) 20 MG tablet Take 20 mg by mouth daily.     lisinopril  (ZESTRIL ) 5 MG tablet Take 1 tablet (5 mg total) by mouth 2 (two) times daily. 180 tablet 3   No current facility-administered medications for this visit.    Allergies-reviewed and updated No Known Allergies  Social History   Social History Narrative   Married (not sure if wife a patient here). 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 Oropharynx normal Neck: no thyromegaly CV: RRR no murmurs rubs or gallops Lungs: CTAB no crackles, wheeze, rhonchi Abdomen: soft/nontender/nondistended/normal bowel sounds. No rebound or guarding.  Ext: no edema Skin: warm, dry Neuro: grossly normal, moves all extremities, PERRLA   Assessment and Plan  68 y.o. male presenting for annual physical.  Health Maintenance counseling: 1. Anticipatory guidance: Patient counseled regarding regular dental exams -q6 months, eye exams -yearly,  avoiding smoking and second hand smoke , limiting alcohol to 2 beverages per day - 0-2 per week, no illicit drugs .   2. Risk factor reduction:  Advised patient of need for regular exercise and diet rich and fruits and vegetables to reduce risk of heart attack and stroke.  Exercise- 3 times a week still in the gym.  Diet/weight management-reasonably healthy ewight and diet.  Wt  Readings from Last 3 Encounters:  10/03/24 155 lb 6.4 oz (70.5 kg)  09/04/23 153 lb 9.6 oz (69.7 kg)  08/07/23 151 lb (68.5 kg)  3. Immunizations/screenings/ancillary studies-consider Shingrix at pharmacy, declines COVID shot  Immunization History  Administered Date(s) Administered   Fluad Quad(high Dose 65+) 08/02/2020, 07/27/2024   Influenza,inj,Quad PF,6+ Mos 08/16/2019, 07/23/2022   PFIZER(Purple Top)SARS-COV-2 Vaccination 12/30/2019, 01/20/2020, 10/07/2020, 09/12/2021   PNEUMOCOCCAL CONJUGATE-20 02/18/2023   Pneumococcal Conjugate-13 03/20/2021   Td 06/14/2009   Tdap 08/16/2019  4. Prostate cancer screening- sees urology regularly but we also trend his PSA-low risk trend previously- plans to see in 2026 Lab Results  Component Value Date   PSA 1.86 09/04/2023   PSA 2.64 08/20/2022   PSA 1.92 02/10/2022   5. Colon cancer screening - 11/25/2019 with 5-year repeat-will be due January of next year  6. Skin cancer screening-had seen Dr. Livingston in the past. advised regular sunscreen use. Denies worrisome, changing, or new skin lesions.  7. Smoking associated screening (lung cancer screening, AAA screen 65-75, UA)-never smoker 8. STD screening -only active with wife  Status of chronic or acute concerns   # Atrial fibrillation- seeing Dr. Mona and will see EP S: Rate controlled with  antiarrhythmic from [place] -  Ethacizine Anticoagulated with eliquis 5 mg twice daily  -considering ablation with EP  with Dr. Cindie A/P: appropriately anticoagulated and rate controlled- continue current medicine . He plans to get scheduled in the new year   #hypertension S: medication: Lisinopril  5 mg twice daily A/P: well controlled continue current medications   #hyperlipidemia- CT calcium scoring of 0 in march 2022 #aortic atherosclerosis  S: Medication:rosuvastatin 20 mg and fish oil  Lab Results  Component Value Date   CHOL 159 09/04/2023   HDL 65.80 09/04/2023   LDLCALC 84 09/04/2023    TRIG 50.0 09/04/2023   CHOLHDL 2 09/04/2023  A/P: slightly above ideal goal but reassuring CT calcium scoring- update Lipid panel For aortic atherosclerosis prefer LDL under 70  Recommended follow up: Return in about 1 year (around 10/03/2025) for physical or sooner if needed.Schedule b4 you leave.  Lab/Order associations: fasting   ICD-10-CM   1. Preventative health care  Z00.00     2. Screening for prostate cancer  Z12.5 PSA, Medicare    3. Mixed hyperlipidemia  E78.2 Lipid panel    CBC with Differential/Platelet    Comprehensive metabolic panel    4. Screen for colon cancer  Z12.11 Ambulatory referral to Gastroenterology    5. Screening exam for skin cancer  Z12.83 Ambulatory referral to Dermatology    6. Essential hypertension, benign  I10 Microalbumin / creatinine urine ratio    7. PAF (paroxysmal atrial fibrillation) (HCC)  I48.0       Meds ordered this encounter  Medications   lisinopril  (ZESTRIL ) 5 MG tablet    Sig: Take 1 tablet (5 mg total) by mouth 2 (two) times daily.    Dispense:  180 tablet    Refill:  3    Return precautions advised.  Garnette Lukes, MD

## 2024-10-04 ENCOUNTER — Ambulatory Visit: Payer: Self-pay | Admitting: Family Medicine

## 2024-10-04 LAB — CBC WITH DIFFERENTIAL/PLATELET
Basophils Absolute: 0 K/uL (ref 0.0–0.1)
Basophils Relative: 0.7 % (ref 0.0–3.0)
Eosinophils Absolute: 0.1 K/uL (ref 0.0–0.7)
Eosinophils Relative: 0.9 % (ref 0.0–5.0)
HCT: 43.7 % (ref 39.0–52.0)
Hemoglobin: 14.8 g/dL (ref 13.0–17.0)
Lymphocytes Relative: 28.5 % (ref 12.0–46.0)
Lymphs Abs: 1.8 K/uL (ref 0.7–4.0)
MCHC: 34 g/dL (ref 30.0–36.0)
MCV: 89.4 fl (ref 78.0–100.0)
Monocytes Absolute: 0.6 K/uL (ref 0.1–1.0)
Monocytes Relative: 9 % (ref 3.0–12.0)
Neutro Abs: 3.9 K/uL (ref 1.4–7.7)
Neutrophils Relative %: 60.9 % (ref 43.0–77.0)
Platelets: 235 K/uL (ref 150.0–400.0)
RBC: 4.89 Mil/uL (ref 4.22–5.81)
RDW: 12.5 % (ref 11.5–15.5)
WBC: 6.3 K/uL (ref 4.0–10.5)

## 2024-10-04 LAB — COMPREHENSIVE METABOLIC PANEL WITH GFR
ALT: 25 U/L (ref 0–53)
AST: 26 U/L (ref 0–37)
Albumin: 4.4 g/dL (ref 3.5–5.2)
Alkaline Phosphatase: 59 U/L (ref 39–117)
BUN: 18 mg/dL (ref 6–23)
CO2: 30 meq/L (ref 19–32)
Calcium: 9.6 mg/dL (ref 8.4–10.5)
Chloride: 101 meq/L (ref 96–112)
Creatinine, Ser: 0.92 mg/dL (ref 0.40–1.50)
GFR: 85.15 mL/min (ref >60.00)
Glucose, Bld: 85 mg/dL (ref 70–99)
Potassium: 4.6 meq/L (ref 3.5–5.1)
Sodium: 137 meq/L (ref 135–145)
Total Bilirubin: 0.8 mg/dL (ref 0.2–1.2)
Total Protein: 6.6 g/dL (ref 6.0–8.3)

## 2024-10-04 LAB — LIPID PANEL
Cholesterol: 172 mg/dL (ref 0–200)
HDL: 63.5 mg/dL (ref >39.00)
LDL Cholesterol: 98 mg/dL (ref 0–99)
NonHDL: 108.51
Total CHOL/HDL Ratio: 3
Triglycerides: 53 mg/dL (ref 0.0–149.0)
VLDL: 10.6 mg/dL (ref 0.0–40.0)

## 2024-10-04 LAB — MICROALBUMIN / CREATININE URINE RATIO
Creatinine,U: 71.2 mg/dL
Microalb Creat Ratio: UNDETERMINED mg/g (ref 0.0–30.0)
Microalb, Ur: <0.7 mg/dL

## 2024-10-04 LAB — PSA, MEDICARE: PSA: 1.73 ng/mL (ref 0.10–4.00)

## 2024-11-12 ENCOUNTER — Encounter: Payer: Self-pay | Admitting: Gastroenterology

## 2024-11-24 NOTE — Progress Notes (Signed)
 Louis Vaughn                                          MRN: [id number]   11/24/2024   The [facility] Quality Team Specialist reviewed this patient medical record for the purposes of chart review for care gap closure. The following were reviewed: abstraction for care gap closure-controlling blood pressure.    [facility] Quality Team
